# Patient Record
Sex: Female | Born: 1940 | Race: White | Hispanic: No | Smoking: Never smoker
Health system: Southern US, Community
[De-identification: ages and names within clinical notes are randomized; demographics above are authoritative.]

## PROBLEM LIST (undated history)

## (undated) DIAGNOSIS — Z9889 Other specified postprocedural states: Secondary | ICD-10-CM

## (undated) DIAGNOSIS — I350 Nonrheumatic aortic (valve) stenosis: Secondary | ICD-10-CM

## (undated) DIAGNOSIS — E039 Hypothyroidism, unspecified: Secondary | ICD-10-CM

## (undated) DIAGNOSIS — I10 Essential (primary) hypertension: Secondary | ICD-10-CM

## (undated) DIAGNOSIS — I351 Nonrheumatic aortic (valve) insufficiency: Secondary | ICD-10-CM

## (undated) DIAGNOSIS — M2170 Unequal limb length (acquired), unspecified site: Secondary | ICD-10-CM

## (undated) DIAGNOSIS — Z8489 Family history of other specified conditions: Secondary | ICD-10-CM

## (undated) DIAGNOSIS — R011 Cardiac murmur, unspecified: Secondary | ICD-10-CM

## (undated) DIAGNOSIS — Q211 Atrial septal defect: Secondary | ICD-10-CM

## (undated) DIAGNOSIS — R0600 Dyspnea, unspecified: Secondary | ICD-10-CM

## (undated) DIAGNOSIS — T8859XA Other complications of anesthesia, initial encounter: Secondary | ICD-10-CM

## (undated) DIAGNOSIS — T4145XA Adverse effect of unspecified anesthetic, initial encounter: Secondary | ICD-10-CM

## (undated) DIAGNOSIS — E785 Hyperlipidemia, unspecified: Secondary | ICD-10-CM

## (undated) DIAGNOSIS — I701 Atherosclerosis of renal artery: Secondary | ICD-10-CM

## (undated) DIAGNOSIS — M1990 Unspecified osteoarthritis, unspecified site: Secondary | ICD-10-CM

## (undated) DIAGNOSIS — I253 Aneurysm of heart: Secondary | ICD-10-CM

## (undated) DIAGNOSIS — I2510 Atherosclerotic heart disease of native coronary artery without angina pectoris: Secondary | ICD-10-CM

## (undated) DIAGNOSIS — Q2112 Patent foramen ovale: Secondary | ICD-10-CM

## (undated) HISTORY — DX: Nonrheumatic aortic (valve) stenosis: I35.0

## (undated) HISTORY — PX: CORONARY ANGIOPLASTY WITH STENT PLACEMENT: SHX49

## (undated) HISTORY — DX: Atherosclerosis of renal artery: I70.1

## (undated) HISTORY — DX: Hyperlipidemia, unspecified: E78.5

## (undated) HISTORY — PX: JOINT REPLACEMENT: SHX530

## (undated) HISTORY — DX: Atrial septal defect: Q21.1

## (undated) HISTORY — PX: RENAL ARTERY STENT: SHX2321

## (undated) HISTORY — PX: TONSILLECTOMY: SUR1361

## (undated) HISTORY — DX: Unequal limb length (acquired), unspecified site: M21.70

## (undated) HISTORY — DX: Hypothyroidism, unspecified: E03.9

## (undated) HISTORY — DX: Nonrheumatic aortic (valve) insufficiency: I35.1

## (undated) HISTORY — PX: ABDOMINAL HYSTERECTOMY: SHX81

## (undated) HISTORY — DX: Aneurysm of heart: I25.3

## (undated) HISTORY — DX: Other specified postprocedural states: Z98.890

## (undated) HISTORY — PX: TUBAL LIGATION: SHX77

## (undated) HISTORY — DX: Patent foramen ovale: Q21.12

---
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
## 2017-05-15 HISTORY — PX: CARDIAC CATHETERIZATION: SHX172

## 2018-11-29 ENCOUNTER — Other Ambulatory Visit: Payer: Self-pay | Admitting: Orthopaedic Surgery

## 2018-12-24 NOTE — Patient Instructions (Addendum)
YOU NEED TO HAVE A COVID 19 TEST ON__8-14-20_____ @_______ , THIS TEST MUST BE DONE BEFORE SURGERY, COME  [address], [place] , [phone]. ONCE YOUR COVID TEST IS COMPLETED, PLEASE BEGIN THE QUARANTINE INSTRUCTIONS AS OUTLINED IN YOUR HANDOUT.                Sherie DonLydia Deadwyler  12/24/2018   Your procedure is scheduled on: 12-31-18   [facility]              Report to short stay  at       0530  AM   1 VISITOR IS ALLOWED TO WAIT IN WAITING ROOM  ONLY DAY OF YOUR SURGERY.    Call this number if you have problems the morning of surgery [phone]    Remember:NO SOLID FOOD AFTER MIDNIGHT THE NIGHT PRIOR TO SURGERY. NOTHING BY MOUTH EXCEPT CLEAR LIQUIDS UNTIL 3 HOURS PRIOR TO SCHEULED SURGERY. PLEASE   FINISH ENSURE DRINK PER SURGEON ORDER 3 HOURS PRIOR TO SCHEDULED SURGERY TIME WHICH NEEDS TO BE COMPLETED AT _0430 AM THEN NOTHING BY MOUTH___________.  CLEAR LIQUID DIET   Foods Allowed                                                                     Foods Excluded  Coffee and tea, regular and decaf                             liquids that you cannot  Plain Jell-O any favor except red or purple                                           see through such as: Fruit ices (not with fruit pulp)                                     milk, soups, orange juice  Iced Popsicles                                    All solid food Carbonated beverages, regular and diet                                    Cranberry, grape and apple juices Sports drinks like Gatorade Lightly seasoned clear broth or consume(fat free) Sugar, honey syrup  Sample Menu Breakfast                                Lunch                                     Supper Cranberry juice  Beef broth                            Chicken broth Jell-O                                     Grape juice                           Apple juice Coffee or tea                        Jell-O                                       Popsicle                                                Coffee or tea                        Coffee or tea  _____________________________________________________________________      BRUSH YOUR TEETH MORNING OF SURGERY AND RINSE YOUR MOUTH OUT, NO CHEWING GUM CANDY OR MINTS.     Take these medicines the morning of surgery with A SIP OF WATER: METOPROLOL, AMLODIPINE                                You may not have any metal on your body including hair pins and              piercings  Do not wear jewelry, make-up, lotions, powders or perfumes, deodorant             Do not wear nail polish.  Do not shave  48 hours prior to surgery.                 Do not bring valuables to the hospital.  IS NOT             RESPONSIBLE   FOR VALUABLES.  Contacts, dentures or bridgework may not be worn into surgery.                Please read over the following fact sheets you were given: ____________________________________________________________________          [facility] - Preparing for Surgery Before surgery, you can play an important role.  Because skin is not sterile, your skin needs to be as free of germs as possible.  You can reduce the number of germs on your skin by washing with CHG (chlorahexidine gluconate) soap before surgery.  CHG is an antiseptic cleaner which kills germs and bonds with the skin to continue killing germs even after washing. Please DO NOT use if you have an allergy to CHG or antibacterial soaps.  If your skin becomes reddened/irritated stop using the CHG and inform your nurse when you arrive at Short Stay. Do not shave (including legs and underarms) for at least 48 hours prior to the first CHG shower.  You may shave your face/neck. Please follow  these instructions carefully:  1.  Shower with CHG Soap the night before surgery and the  morning of Surgery.  2.  If you choose to wash your hair, wash your hair first as usual with  your  normal  shampoo.  3.  After you shampoo, rinse your hair and body thoroughly to remove the  shampoo.                           4.  Use CHG as you would any other liquid soap.  You can apply chg directly  to the skin and wash                       Gently with a scrungie or clean washcloth.  5.  Apply the CHG Soap to your body ONLY FROM THE NECK DOWN.   Do not use on face/ open                           Wound or open sores. Avoid contact with eyes, ears mouth and genitals (private parts).                       Wash face,  Genitals (private parts) with your normal soap.             6.  Wash thoroughly, paying special attention to the area where your surgery  will be performed.  7.  Thoroughly rinse your body with warm water from the neck down.  8.  DO NOT shower/wash with your normal soap after using and rinsing off  the CHG Soap.                9.  Pat yourself dry with a clean towel.            10.  Wear clean pajamas.            11.  Place clean sheets on your bed the night of your first shower and do not  sleep with pets. Day of Surgery : Do not apply any lotions/deodorants the morning of surgery.  Please wear clean clothes to the hospital/surgery center.  FAILURE TO FOLLOW THESE INSTRUCTIONS MAY RESULT IN THE CANCELLATION OF YOUR SURGERY PATIENT SIGNATURE_________________________________  NURSE SIGNATURE__________________________________  ________________________________________________________________________  WHAT IS A BLOOD TRANSFUSION? Blood Transfusion Information  A transfusion is the replacement of blood or some of its parts. Blood is made up of multiple cells which provide different functions.  Red blood cells carry oxygen and are used for blood loss replacement.  White blood cells fight against infection.  Platelets control bleeding.  Plasma helps clot blood.  Other blood products are available for specialized needs, such as hemophilia or other clotting  disorders. BEFORE THE TRANSFUSION  Who gives blood for transfusions?   Healthy volunteers who are fully evaluated to make sure their blood is safe. This is blood bank blood. Transfusion therapy is the safest it has ever been in the practice of medicine. Before blood is taken from a donor, a complete history is taken to make sure that person has no history of diseases nor engages in risky social behavior (examples are intravenous drug use or sexual activity with multiple partners). The donor's travel history is screened to minimize risk of transmitting infections, such as malaria. The donated blood is tested for signs of infectious diseases, such as HIV and  hepatitis. The blood is then tested to be sure it is compatible with you in order to minimize the chance of a transfusion reaction. If you or a relative donates blood, this is often done in anticipation of surgery and is not appropriate for emergency situations. It takes many days to process the donated blood. RISKS AND COMPLICATIONS Although transfusion therapy is very safe and saves many lives, the main dangers of transfusion include:   Getting an infectious disease.  Developing a transfusion reaction. This is an allergic reaction to something in the blood you were given. Every precaution is taken to prevent this. The decision to have a blood transfusion has been considered carefully by your caregiver before blood is given. Blood is not given unless the benefits outweigh the risks. AFTER THE TRANSFUSION  Right after receiving a blood transfusion, you will usually feel much better and more energetic. This is especially true if your red blood cells have gotten low (anemic). The transfusion raises the level of the red blood cells which carry oxygen, and this usually causes an energy increase.  The nurse administering the transfusion will monitor you carefully for complications. HOME CARE INSTRUCTIONS  No special instructions are needed after a  transfusion. You may find your energy is better. Speak with your caregiver about any limitations on activity for underlying diseases you may have. SEEK MEDICAL CARE IF:   Your condition is not improving after your transfusion.  You develop redness or irritation at the intravenous (IV) site. SEEK IMMEDIATE MEDICAL CARE IF:  Any of the following symptoms occur over the next 12 hours:  Shaking chills.  You have a temperature by mouth above 102 F (38.9 C), not controlled by medicine.  Chest, back, or muscle pain.  People around you feel you are not acting correctly or are confused.  Shortness of breath or difficulty breathing.  Dizziness and fainting.  You get a rash or develop hives.  You have a decrease in urine output.  Your urine turns a dark color or changes to pink, red, or brown. Any of the following symptoms occur over the next 10 days:  You have a temperature by mouth above 102 F (38.9 C), not controlled by medicine.  Shortness of breath.  Weakness after normal activity.  The white part of the eye turns yellow (jaundice).  You have a decrease in the amount of urine or are urinating less often.  Your urine turns a dark color or changes to pink, red, or brown. Document Released: 04/28/2000 Document Revised: 07/24/2011 Document Reviewed: 12/16/2007 ExitCare Patient Information 2014 [place].  _______________________________________________________________________  Incentive Spirometer  An incentive spirometer is a tool that can help keep your lungs clear and active. This tool measures how well you are filling your lungs with each breath. Taking long deep breaths may help reverse or decrease the chance of developing breathing (pulmonary) problems (especially infection) following:  A long period of time when you are unable to move or be active. BEFORE THE PROCEDURE   If the spirometer includes an indicator to show your best effort, your nurse or  respiratory therapist will set it to a desired goal.  If possible, sit up straight or lean slightly forward. Try not to slouch.  Hold the incentive spirometer in an upright position. INSTRUCTIONS FOR USE  1. Sit on the edge of your bed if possible, or sit up as far as you can in bed or on a chair. 2. Hold the incentive spirometer in an upright position.  3. Breathe out normally. 4. Place the mouthpiece in your mouth and seal your lips tightly around it. 5. Breathe in slowly and as deeply as possible, raising the piston or the ball toward the top of the column. 6. Hold your breath for 3-5 seconds or for as long as possible. Allow the piston or ball to fall to the bottom of the column. 7. Remove the mouthpiece from your mouth and breathe out normally. 8. Rest for a few seconds and repeat Steps 1 through 7 at least 10 times every 1-2 hours when you are awake. Take your time and take a few normal breaths between deep breaths. 9. The spirometer may include an indicator to show your best effort. Use the indicator as a goal to work toward during each repetition. 10. After each set of 10 deep breaths, practice coughing to be sure your lungs are clear. If you have an incision (the cut made at the time of surgery), support your incision when coughing by placing a pillow or rolled up towels firmly against it. Once you are able to get out of bed, walk around indoors and cough well. You may stop using the incentive spirometer when instructed by your caregiver.  RISKS AND COMPLICATIONS  Take your time so you do not get dizzy or light-headed.  If you are in pain, you may need to take or ask for pain medication before doing incentive spirometry. It is harder to take a deep breath if you are having pain. AFTER USE  Rest and breathe slowly and easily.  It can be helpful to keep track of a log of your progress. Your caregiver can provide you with a simple table to help with this. If you are using the  spirometer at home, follow these instructions: [place] IF:   You are having difficultly using the spirometer.  You have trouble using the spirometer as often as instructed.  Your pain medication is not giving enough relief while using the spirometer.  You develop fever of 100.5 F (38.1 C) or higher. SEEK IMMEDIATE MEDICAL CARE IF:   You cough up bloody sputum that had not been present before.  You develop fever of 102 F (38.9 C) or greater.  You develop worsening pain at or near the incision site. MAKE SURE YOU:   Understand these instructions.  Will watch your condition.  Will get help right away if you are not doing well or get worse. Document Released: 09/11/2006 Document Revised: 07/24/2011 Document Reviewed: 11/12/2006 [facility] Patient Information 2014 [place], [place].   ________________________________________________________________________

## 2018-12-25 ENCOUNTER — Encounter (HOSPITAL_COMMUNITY): Payer: Self-pay

## 2018-12-25 ENCOUNTER — Ambulatory Visit (HOSPITAL_COMMUNITY)
Admission: RE | Admit: 2018-12-25 | Discharge: 2018-12-25 | Disposition: A | Discharge status: Home / Self Care | Payer: Medicare Other | Source: Ambulatory Visit | Attending: Orthopaedic Surgery | Admitting: Orthopaedic Surgery

## 2018-12-25 ENCOUNTER — Encounter (HOSPITAL_COMMUNITY)
Admission: RE | Admit: 2018-12-25 | Discharge: 2018-12-25 | Disposition: A | Discharge status: Home / Self Care | Payer: Medicare Other | Source: Ambulatory Visit | Attending: Orthopaedic Surgery | Admitting: Orthopaedic Surgery

## 2018-12-25 ENCOUNTER — Other Ambulatory Visit: Payer: Self-pay

## 2018-12-25 DIAGNOSIS — M1611 Unilateral primary osteoarthritis, right hip: Secondary | ICD-10-CM | POA: Diagnosis not present

## 2018-12-25 DIAGNOSIS — Z01818 Encounter for other preprocedural examination: Secondary | ICD-10-CM | POA: Insufficient documentation

## 2018-12-25 HISTORY — DX: Unspecified osteoarthritis, unspecified site: M19.90

## 2018-12-25 HISTORY — DX: Dyspnea, unspecified: R06.00

## 2018-12-25 HISTORY — DX: Other complications of anesthesia, initial encounter: T88.59XA

## 2018-12-25 HISTORY — DX: Essential (primary) hypertension: I10

## 2018-12-25 HISTORY — DX: Family history of other specified conditions: Z84.89

## 2018-12-25 HISTORY — DX: Cardiac murmur, unspecified: R01.1

## 2018-12-25 HISTORY — DX: Atherosclerotic heart disease of native coronary artery without angina pectoris: I25.10

## 2018-12-25 LAB — BASIC METABOLIC PANEL
Anion gap: 8 (ref 5–15)
BUN: 26 mg/dL — ABNORMAL HIGH (ref 8–23)
CO2: 28 mmol/L (ref 22–32)
Calcium: 9.6 mg/dL (ref 8.9–10.3)
Chloride: 98 mmol/L (ref 98–111)
Creatinine, Ser: 1.41 mg/dL — ABNORMAL HIGH (ref 0.44–1.00)
GFR calc Af Amer: 41 mL/min — ABNORMAL LOW (ref >60)
GFR calc non Af Amer: 36 mL/min — ABNORMAL LOW (ref >60)
Glucose, Bld: 95 mg/dL (ref 70–99)
Potassium: 4.7 mmol/L (ref 3.5–5.1)
Sodium: 134 mmol/L — ABNORMAL LOW (ref 135–145)

## 2018-12-25 LAB — CBC WITH DIFFERENTIAL/PLATELET
Abs Immature Granulocytes: 0.01 10*3/uL (ref 0.00–0.07)
Basophils Absolute: 0 10*3/uL (ref 0.0–0.1)
Basophils Relative: 1 %
Eosinophils Absolute: 0.1 10*3/uL (ref 0.0–0.5)
Eosinophils Relative: 3 %
HCT: 38.9 % (ref 36.0–46.0)
Hemoglobin: 12.2 g/dL (ref 12.0–15.0)
Immature Granulocytes: 0 %
Lymphocytes Relative: 26 %
Lymphs Abs: 1.2 10*3/uL (ref 0.7–4.0)
MCH: 29 pg (ref 26.0–34.0)
MCHC: 31.4 g/dL (ref 30.0–36.0)
MCV: 92.4 fL (ref 80.0–100.0)
Monocytes Absolute: 0.5 10*3/uL (ref 0.1–1.0)
Monocytes Relative: 10 %
Neutro Abs: 3 10*3/uL (ref 1.7–7.7)
Neutrophils Relative %: 60 %
Platelets: 248 10*3/uL (ref 150–400)
RBC: 4.21 MIL/uL (ref 3.87–5.11)
RDW: 14.5 % (ref 11.5–15.5)
WBC: 4.8 10*3/uL (ref 4.0–10.5)
nRBC: 0 % (ref 0.0–0.2)

## 2018-12-25 LAB — URINALYSIS, ROUTINE W REFLEX MICROSCOPIC
Bilirubin Urine: NEGATIVE
Glucose, UA: NEGATIVE mg/dL
Hgb urine dipstick: NEGATIVE
Ketones, ur: NEGATIVE mg/dL
Leukocytes,Ua: NEGATIVE
Nitrite: NEGATIVE
Protein, ur: NEGATIVE mg/dL
Specific Gravity, Urine: 1.006 (ref 1.005–1.030)
pH: 7 (ref 5.0–8.0)

## 2018-12-25 LAB — PROTIME-INR
INR: 1 (ref 0.8–1.2)
Prothrombin Time: 12.6 seconds (ref 11.4–15.2)

## 2018-12-25 LAB — ABO/RH: ABO/RH(D): O POS

## 2018-12-25 LAB — SURGICAL PCR SCREEN
MRSA, PCR: NEGATIVE
Staphylococcus aureus: NEGATIVE

## 2018-12-25 LAB — APTT: aPTT: 58 seconds — ABNORMAL HIGH (ref 24–36)

## 2018-12-26 ENCOUNTER — Encounter (HOSPITAL_COMMUNITY): Payer: Self-pay

## 2018-12-27 ENCOUNTER — Other Ambulatory Visit: Payer: Self-pay | Admitting: Orthopaedic Surgery

## 2018-12-27 ENCOUNTER — Other Ambulatory Visit (HOSPITAL_COMMUNITY)
Admission: RE | Admit: 2018-12-27 | Discharge: 2018-12-27 | Disposition: A | Discharge status: Home / Self Care | Payer: Medicare Other | Source: Ambulatory Visit | Attending: Orthopaedic Surgery | Admitting: Orthopaedic Surgery

## 2018-12-27 DIAGNOSIS — M1611 Unilateral primary osteoarthritis, right hip: Secondary | ICD-10-CM | POA: Insufficient documentation

## 2018-12-27 DIAGNOSIS — Z20828 Contact with and (suspected) exposure to other viral communicable diseases: Secondary | ICD-10-CM | POA: Diagnosis not present

## 2018-12-27 DIAGNOSIS — Z01812 Encounter for preprocedural laboratory examination: Secondary | ICD-10-CM | POA: Insufficient documentation

## 2018-12-27 LAB — SARS CORONAVIRUS 2 (TAT 6-24 HRS): SARS Coronavirus 2: NEGATIVE

## 2018-12-27 NOTE — Care Plan (Signed)
Spoke with patient prior to surgery. All questions answered. She plans to return to her apartment at [place]. She will receive her therapy onsite there. I have ordered her a rolling walker and a 3n1 for home use. Orders for therapy sent to [facility] at the therapy department at [place]. Patient and MD agreeable with plan. Choice offered    Ladell Heads, [place]

## 2018-12-30 MED ORDER — BUPIVACAINE LIPOSOME 1.3 % IJ SUSP
10.0000 mL | Freq: Once | INTRAMUSCULAR | Status: DC
  Filled 2018-12-30: qty 10

## 2018-12-30 MED ORDER — TRANEXAMIC ACID 1000 MG/10ML IV SOLN
2000.0000 mg | INTRAVENOUS | Status: DC
  Filled 2018-12-30: qty 20

## 2018-12-30 NOTE — Progress Notes (Signed)
Anesthesia Chart Review   Case: [id number] Date/Time: 12/31/18 0715   Procedure: Right Anterior Hip Arthroplasty (Right Hip)   Anesthesia type: Spinal   Pre-op diagnosis: Right Hip Degenerative Joint Disease   Location: [facility] ROOM 05 / [facility]   Surgeon: Melrose Nakayama, MD      DISCUSSION:78 y.o. never smoker with h/o HTN, renal artery stenosis, CAD (DES mid [facility] 11/19/17), right hip DJD scheduled for above procedure 12/31/2018.    Pt last seen by cardiologist, Dr. Alla German, 10/23/2018.  She is currently on ASA, she has completed 6 months of her dual antiplatelet therapy.  Per OV note, "She is contemplating hip replacement surgery and we discussed that should be relatively low risk given fully revascularized and normal EF."  Anticipate pt can proceed with planned procedure barring acute status change.    VS: BP (!) 161/68   Pulse 67   Temp 36.5 C (Oral)   Resp 16   Ht 5' (1.524 m)   Wt 62.6 kg   SpO2 100%   BMI 26.95 kg/m   PROVIDERS: Purvis Sheffield, MD  Alla German, MD is Cardiologist  LABS: Labs reviewed: Acceptable for surgery. (all labs ordered are listed, but only abnormal results are displayed)  Labs Reviewed  APTT - Abnormal; Notable for the following components:      Result Value   aPTT 58 (*)    All other components within normal limits  BASIC METABOLIC PANEL - Abnormal; Notable for the following components:   Sodium 134 (*)    BUN 26 (*)    Creatinine, Ser 1.41 (*)    GFR calc non Af Amer 36 (*)    GFR calc Af Amer 41 (*)    All other components within normal limits  URINALYSIS, ROUTINE W REFLEX MICROSCOPIC - Abnormal; Notable for the following components:   Color, Urine STRAW (*)    All other components within normal limits  SURGICAL PCR SCREEN  CBC WITH DIFFERENTIAL/PLATELET  PROTIME-INR  TYPE AND SCREEN  ABO/RH     IMAGES:   EKG: 12/25/2018 Rate 65 bpm Normal sinus rhythm  Normal ECG   CV: Echo 10/17/2018 Conclusions The left  ventricular chamber size is normal There is normal left ventricular systolic function The estimated ejection fraction is 60-65% Abnormal left ventricular diastolic filling is observed, consistent with impaired relaxation. The left atrium is mildly dilated There is mild aortic stenosis  Past Medical History:  Diagnosis Date  . Arthritis   . Complication of anesthesia    Trouble voiding  after  . Coronary artery disease   . Dyspnea    with activity  . Family history of adverse reaction to anesthesia    sister is hard to wake up and violent vomiting  . Heart murmur   . Hypertension     Past Surgical History:  Procedure Laterality Date  . ABDOMINAL HYSTERECTOMY    . CARDIAC CATHETERIZATION    . CORONARY ANGIOPLASTY WITH STENT PLACEMENT     x1  . JOINT REPLACEMENT     left hip  . RENAL ARTERY STENT    . TONSILLECTOMY    . TUBAL LIGATION      MEDICATIONS: . acetaminophen (TYLENOL) 500 MG tablet  . amLODipine (NORVASC) 5 MG tablet  . aspirin EC 81 MG tablet  . calcium carbonate (OSCAL) 1500 (600 Ca) MG TABS tablet  . levothyroxine (SYNTHROID) 88 MCG tablet  . lisinopril (ZESTRIL) 40 MG tablet  . metoprolol succinate (TOPROL-XL) 25 MG 24 hr  tablet  . pravastatin (PRAVACHOL) 20 MG tablet   No current facility-administered medications for this encounter.    Melene Muller. [START ON 12/31/2018] bupivacaine liposome (EXPAREL) 1.3 % injection 133 mg  . [START ON 12/31/2018] tranexamic acid (CYKLOKAPRON) 2,000 mg in sodium chloride 0.9 % 50 mL Topical Application     Janey GentaJessica Jaileigh Weimer, PA-C [facility] Pre-Surgical Testing [phone] 12/30/18  2:43 PM

## 2018-12-30 NOTE — H&P (Signed)
TOTAL HIP ADMISSION H&P  Patient is admitted for right total hip arthroplasty.  Subjective:  Chief Complaint: right hip pain  HPI: Caitlin Logan, 78 y.o. female, has a history of pain and functional disability in the right hip(s) due to arthritis and patient has failed non-surgical conservative treatments for greater than 12 weeks to include NSAID's and/or analgesics, flexibility and strengthening excercises, use of assistive devices, weight reduction as appropriate and activity modification.  Onset of symptoms was gradual starting 5 years ago with gradually worsening course since that time.The patient noted no past surgery on the right hip(s).  Patient currently rates pain in the right hip at 10 out of 10 with activity. Patient has night pain, worsening of pain with activity and weight bearing, trendelenberg gait, pain that interfers with activities of daily living, pain with passive range of motion and crepitus. Patient has evidence of subchondral cysts, subchondral sclerosis, periarticular osteophytes and joint space narrowing by imaging studies. This condition presents safety issues increasing the risk of falls. There is no current active infection.  There are no active problems to display for this patient.  Past Medical History:  Diagnosis Date  . Arthritis   . Complication of anesthesia    Trouble voiding  after  . Coronary artery disease   . Dyspnea    with activity  . Family history of adverse reaction to anesthesia    sister is hard to wake up and violent vomiting  . Heart murmur   . Hypertension     Past Surgical History:  Procedure Laterality Date  . ABDOMINAL HYSTERECTOMY    . CARDIAC CATHETERIZATION    . CORONARY ANGIOPLASTY WITH STENT PLACEMENT     x1  . JOINT REPLACEMENT     left hip  . RENAL ARTERY STENT    . TONSILLECTOMY    . TUBAL LIGATION      Current Facility-Administered Medications  Medication Dose Route Frequency Provider Last Rate Last Dose  . [START  ON 12/31/2018] bupivacaine liposome (EXPAREL) 1.3 % injection 133 mg  10 mL Other Once Melrose Nakayama, MD      . Derrill Memo ON 12/31/2018] tranexamic acid (CYKLOKAPRON) 2,000 mg in sodium chloride 0.9 % 50 mL Topical Application  8,182 mg Topical To OR Melrose Nakayama, MD       Current Outpatient Medications  Medication Sig Dispense Refill Last Dose  . acetaminophen (TYLENOL) 500 MG tablet Take 500 mg by mouth daily as needed for moderate pain.     Marland Kitchen amLODipine (NORVASC) 5 MG tablet Take 2.5 mg by mouth 2 (two) times daily.     Marland Kitchen aspirin EC 81 MG tablet Take 81 mg by mouth daily.     . calcium carbonate (OSCAL) 1500 (600 Ca) MG TABS tablet Take 600 mg of elemental calcium by mouth daily.     Marland Kitchen levothyroxine (SYNTHROID) 88 MCG tablet Take 88 mcg by mouth at bedtime.     Marland Kitchen lisinopril (ZESTRIL) 40 MG tablet Take 40 mg by mouth daily.     . metoprolol succinate (TOPROL-XL) 25 MG 24 hr tablet Take 12.5 mg by mouth 2 (two) times daily.     . pravastatin (PRAVACHOL) 20 MG tablet Take 20 mg by mouth at bedtime.      No Known Allergies  Social History   Tobacco Use  . Smoking status: Never Smoker  . Smokeless tobacco: Never Used  Substance Use Topics  . Alcohol use: Yes    Alcohol/week: 1.0 standard drinks  Types: 1 Glasses of wine per week    Comment: 2 x a week    No family history on file.   Review of Systems  Musculoskeletal: Positive for joint pain.       Right hip  All other systems reviewed and are negative.   Objective:  Physical Exam  Constitutional: She is oriented to person, place, and time. She appears well-developed and well-nourished.  HENT:  Head: Normocephalic and atraumatic.  Eyes: Pupils are equal, round, and reactive to light.  Neck: Normal range of motion.  Cardiovascular: Normal rate and regular rhythm.  Respiratory: Effort normal.  GI: Soft.  Musculoskeletal:     Comments: Right hip motion is limited and painful in internal rotation.  Her leg lengths are a  little bit unequal with this side slightly short.  Sensation and motor function are intact distally.  She has palpable pulses in her feet.  Opposite hip moves well.  She has a well-healed posterior scar there.  Neurological: She is alert and oriented to person, place, and time.  Skin: Skin is warm and dry.  Psychiatric: She has a normal mood and affect. Her behavior is normal. Judgment and thought content normal.    Vital signs in last 24 hours:    Labs:   Estimated body mass index is 26.95 kg/m as calculated from the following:   Height as of 12/25/18: 5' (1.524 m).   Weight as of 12/25/18: 62.6 kg.   Imaging Review Plain radiographs demonstrate severe degenerative joint disease of the right hip(s). The bone quality appears to be good for age and reported activity level.      Assessment/Plan:  End stage primary arthritis, right hip(s)  The patient history, physical examination, clinical judgement of the provider and imaging studies are consistent with end stage degenerative joint disease of the right hip(s) and total hip arthroplasty is deemed medically necessary. The treatment options including medical management, injection therapy, arthroscopy and arthroplasty were discussed at length. The risks and benefits of total hip arthroplasty were presented and reviewed. The risks due to aseptic loosening, infection, stiffness, dislocation/subluxation,  thromboembolic complications and other imponderables were discussed.  The patient acknowledged the explanation, agreed to proceed with the plan and consent was signed. Patient is being admitted for inpatient treatment for surgery, pain control, PT, OT, prophylactic antibiotics, VTE prophylaxis, progressive ambulation and ADL's and discharge planning.The patient is planning to be discharged home with home health services

## 2018-12-30 NOTE — Anesthesia Preprocedure Evaluation (Addendum)
Anesthesia Evaluation  Patient identified by MRN, date of birth, ID band Patient awake    Reviewed: Allergy & Precautions, NPO status , Patient's Chart, lab work & pertinent test results  Airway Mallampati: II  TM Distance: >3 FB Neck ROM: Full    Dental no notable dental hx.    Pulmonary neg pulmonary ROS,    Pulmonary exam normal breath sounds clear to auscultation       Cardiovascular hypertension, + CAD and + Cardiac Stents (DES 7/19)  Normal cardiovascular exam Rhythm:Regular Rate:Normal     Neuro/Psych negative neurological ROS  negative psych ROS   GI/Hepatic negative GI ROS, Neg liver ROS,   Endo/Other  negative endocrine ROS  Renal/GU negative Renal ROS  negative genitourinary   Musculoskeletal negative musculoskeletal ROS (+)   Abdominal   Peds negative pediatric ROS (+)  Hematology negative hematology ROS (+)   Anesthesia Other Findings   Reproductive/Obstetrics negative OB ROS                            Anesthesia Physical Anesthesia Plan  ASA: II  Anesthesia Plan: Spinal   Post-op Pain Management:    Induction: Intravenous  PONV Risk Score and Plan: 2 and Treatment may vary due to age or medical condition  Airway Management Planned: Simple Face Mask  Additional Equipment:   Intra-op Plan:   Post-operative Plan:   Informed Consent: I have reviewed the patients History and Physical, chart, labs and discussed the procedure including the risks, benefits and alternatives for the proposed anesthesia with the patient or authorized representative who has indicated his/her understanding and acceptance.     Dental advisory given  Plan Discussed with: CRNA  Anesthesia Plan Comments: (See PAT note 12/25/2018, Konrad Felix, PA-C)       Anesthesia Quick Evaluation

## 2018-12-31 ENCOUNTER — Encounter (HOSPITAL_COMMUNITY)
Admission: RE | Disposition: A | Discharge status: Skilled Nursing Facility | Payer: Self-pay | Source: Home / Self Care | Attending: Orthopaedic Surgery

## 2018-12-31 ENCOUNTER — Ambulatory Visit (HOSPITAL_COMMUNITY): Payer: Medicare Other

## 2018-12-31 ENCOUNTER — Ambulatory Visit (HOSPITAL_COMMUNITY): Payer: Medicare Other | Admitting: Physician Assistant

## 2018-12-31 ENCOUNTER — Other Ambulatory Visit: Payer: Self-pay

## 2018-12-31 ENCOUNTER — Encounter (HOSPITAL_COMMUNITY): Payer: Self-pay | Admitting: *Deleted

## 2018-12-31 ENCOUNTER — Observation Stay (HOSPITAL_COMMUNITY)
Admission: RE | Admit: 2018-12-31 | Discharge: 2019-01-02 | Disposition: A | Discharge status: Skilled Nursing Facility | Payer: Medicare Other | Attending: Orthopaedic Surgery | Admitting: Orthopaedic Surgery

## 2018-12-31 ENCOUNTER — Ambulatory Visit (HOSPITAL_COMMUNITY): Payer: Medicare Other | Admitting: Anesthesiology

## 2018-12-31 DIAGNOSIS — Z79899 Other long term (current) drug therapy: Secondary | ICD-10-CM | POA: Insufficient documentation

## 2018-12-31 DIAGNOSIS — Z955 Presence of coronary angioplasty implant and graft: Secondary | ICD-10-CM | POA: Insufficient documentation

## 2018-12-31 DIAGNOSIS — Z419 Encounter for procedure for purposes other than remedying health state, unspecified: Secondary | ICD-10-CM

## 2018-12-31 DIAGNOSIS — Z96642 Presence of left artificial hip joint: Secondary | ICD-10-CM | POA: Diagnosis not present

## 2018-12-31 DIAGNOSIS — Z7982 Long term (current) use of aspirin: Secondary | ICD-10-CM | POA: Insufficient documentation

## 2018-12-31 DIAGNOSIS — I2510 Atherosclerotic heart disease of native coronary artery without angina pectoris: Secondary | ICD-10-CM | POA: Insufficient documentation

## 2018-12-31 DIAGNOSIS — Z79890 Hormone replacement therapy (postmenopausal): Secondary | ICD-10-CM | POA: Diagnosis not present

## 2018-12-31 DIAGNOSIS — M1611 Unilateral primary osteoarthritis, right hip: Principal | ICD-10-CM | POA: Diagnosis present

## 2018-12-31 DIAGNOSIS — I10 Essential (primary) hypertension: Secondary | ICD-10-CM | POA: Insufficient documentation

## 2018-12-31 HISTORY — PX: TOTAL HIP ARTHROPLASTY: SHX124

## 2018-12-31 LAB — TYPE AND SCREEN
ABO/RH(D): O POS
Antibody Screen: NEGATIVE

## 2018-12-31 SURGERY — ARTHROPLASTY, HIP, TOTAL, ANTERIOR APPROACH
Anesthesia: Spinal | Site: Hip | Laterality: Right

## 2018-12-31 MED ORDER — DIPHENHYDRAMINE HCL 12.5 MG/5ML PO ELIX: 12.5000 mg | ORAL_SOLUTION | ORAL | Status: DC | PRN

## 2018-12-31 MED ORDER — SODIUM CHLORIDE 0.9 % IV SOLN
INTRAVENOUS | Status: DC | PRN
  Administered 2018-12-31: 20 ug/min via INTRAVENOUS

## 2018-12-31 MED ORDER — DEXAMETHASONE SODIUM PHOSPHATE 10 MG/ML IJ SOLN
INTRAMUSCULAR | Status: AC
  Filled 2018-12-31: qty 1

## 2018-12-31 MED ORDER — LACTATED RINGERS IV SOLN
INTRAVENOUS | Status: DC
  Administered 2018-12-31 – 2019-01-01 (×2): via INTRAVENOUS

## 2018-12-31 MED ORDER — STERILE WATER FOR IRRIGATION IR SOLN
Status: DC | PRN
  Administered 2018-12-31 (×2): 1000 mL

## 2018-12-31 MED ORDER — METOPROLOL SUCCINATE ER 25 MG PO TB24
12.5000 mg | ORAL_TABLET | Freq: Two times a day (BID) | ORAL | Status: DC
  Administered 2018-12-31 – 2019-01-02 (×4): 12.5 mg via ORAL
  Filled 2018-12-31 (×5): qty 1

## 2018-12-31 MED ORDER — KETOROLAC TROMETHAMINE 15 MG/ML IJ SOLN
7.5000 mg | Freq: Four times a day (QID) | INTRAMUSCULAR | Status: AC
  Administered 2018-12-31 – 2019-01-01 (×4): 7.5 mg via INTRAVENOUS
  Filled 2018-12-31 (×3): qty 1

## 2018-12-31 MED ORDER — ROCURONIUM BROMIDE 10 MG/ML (PF) SYRINGE
PREFILLED_SYRINGE | INTRAVENOUS | Status: AC
  Filled 2018-12-31: qty 10

## 2018-12-31 MED ORDER — HYDROCODONE-ACETAMINOPHEN 7.5-325 MG PO TABS
ORAL_TABLET | ORAL | Status: AC
  Filled 2018-12-31: qty 1

## 2018-12-31 MED ORDER — METHOCARBAMOL 500 MG IVPB - SIMPLE MED
500.0000 mg | Freq: Four times a day (QID) | INTRAVENOUS | Status: DC | PRN
  Administered 2018-12-31: 500 mg via INTRAVENOUS
  Filled 2018-12-31: qty 50

## 2018-12-31 MED ORDER — PHENOL 1.4 % MT LIQD: 1.0000 | OROMUCOSAL | Status: DC | PRN

## 2018-12-31 MED ORDER — ONDANSETRON HCL 4 MG/2ML IJ SOLN
4.0000 mg | Freq: Four times a day (QID) | INTRAMUSCULAR | Status: DC | PRN
  Administered 2018-12-31 – 2019-01-01 (×2): 4 mg via INTRAVENOUS
  Filled 2018-12-31 (×2): qty 2

## 2018-12-31 MED ORDER — ACETAMINOPHEN 325 MG PO TABS: 325.0000 mg | ORAL_TABLET | Freq: Four times a day (QID) | ORAL | Status: DC | PRN

## 2018-12-31 MED ORDER — ONDANSETRON HCL 4 MG/2ML IJ SOLN
INTRAMUSCULAR | Status: AC
  Filled 2018-12-31: qty 2

## 2018-12-31 MED ORDER — MEPERIDINE HCL 50 MG/ML IJ SOLN: 6.2500 mg | INTRAMUSCULAR | Status: DC | PRN

## 2018-12-31 MED ORDER — METOCLOPRAMIDE HCL 5 MG PO TABS: 5.0000 mg | ORAL_TABLET | Freq: Three times a day (TID) | ORAL | Status: DC | PRN

## 2018-12-31 MED ORDER — BUPIVACAINE-EPINEPHRINE (PF) 0.5% -1:200000 IJ SOLN
INTRAMUSCULAR | Status: AC
  Filled 2018-12-31: qty 30

## 2018-12-31 MED ORDER — FENTANYL CITRATE (PF) 100 MCG/2ML IJ SOLN
INTRAMUSCULAR | Status: DC | PRN
  Administered 2018-12-31 (×2): 50 ug via INTRAVENOUS

## 2018-12-31 MED ORDER — FENTANYL CITRATE (PF) 100 MCG/2ML IJ SOLN: 25.0000 ug | INTRAMUSCULAR | Status: DC | PRN

## 2018-12-31 MED ORDER — EPHEDRINE SULFATE-NACL 50-0.9 MG/10ML-% IV SOSY
PREFILLED_SYRINGE | INTRAVENOUS | Status: DC | PRN
  Administered 2018-12-31: 10 mg via INTRAVENOUS

## 2018-12-31 MED ORDER — METOCLOPRAMIDE HCL 5 MG/ML IJ SOLN: 10.0000 mg | Freq: Once | INTRAMUSCULAR | Status: DC | PRN

## 2018-12-31 MED ORDER — LACTATED RINGERS IV SOLN: INTRAVENOUS | Status: DC

## 2018-12-31 MED ORDER — DEXAMETHASONE SODIUM PHOSPHATE 10 MG/ML IJ SOLN
INTRAMUSCULAR | Status: DC | PRN
  Administered 2018-12-31: 10 mg via INTRAVENOUS

## 2018-12-31 MED ORDER — GLYCOPYRROLATE 0.2 MG/ML IJ SOLN
INTRAMUSCULAR | Status: DC | PRN
  Administered 2018-12-31: 0.2 mg via INTRAVENOUS

## 2018-12-31 MED ORDER — ASPIRIN 81 MG PO CHEW
81.0000 mg | CHEWABLE_TABLET | Freq: Two times a day (BID) | ORAL | Status: DC
  Administered 2019-01-01 – 2019-01-02 (×3): 81 mg via ORAL
  Filled 2018-12-31 (×4): qty 1

## 2018-12-31 MED ORDER — MIDAZOLAM HCL 2 MG/2ML IJ SOLN
INTRAMUSCULAR | Status: AC
  Filled 2018-12-31: qty 2

## 2018-12-31 MED ORDER — TRANEXAMIC ACID 1000 MG/10ML IV SOLN
INTRAVENOUS | Status: DC | PRN
  Administered 2018-12-31: 2000 mg via TOPICAL

## 2018-12-31 MED ORDER — 0.9 % SODIUM CHLORIDE (POUR BTL) OPTIME
TOPICAL | Status: DC | PRN
  Administered 2018-12-31: 1000 mL

## 2018-12-31 MED ORDER — TRANEXAMIC ACID-NACL 1000-0.7 MG/100ML-% IV SOLN
1000.0000 mg | Freq: Once | INTRAVENOUS | Status: AC
  Administered 2018-12-31: 13:00:00 1000 mg via INTRAVENOUS
  Filled 2018-12-31 (×2): qty 100

## 2018-12-31 MED ORDER — TRANEXAMIC ACID-NACL 1000-0.7 MG/100ML-% IV SOLN
1000.0000 mg | INTRAVENOUS | Status: AC
  Administered 2018-12-31: 1000 mg via INTRAVENOUS

## 2018-12-31 MED ORDER — LIDOCAINE 2% (20 MG/ML) 5 ML SYRINGE
INTRAMUSCULAR | Status: AC
  Filled 2018-12-31: qty 5

## 2018-12-31 MED ORDER — PROPOFOL 10 MG/ML IV BOLUS
INTRAVENOUS | Status: AC
  Filled 2018-12-31: qty 80

## 2018-12-31 MED ORDER — CEFAZOLIN SODIUM-DEXTROSE 2-4 GM/100ML-% IV SOLN
2.0000 g | INTRAVENOUS | Status: AC
  Administered 2018-12-31: 2 g via INTRAVENOUS

## 2018-12-31 MED ORDER — ONDANSETRON HCL 4 MG PO TABS: 4.0000 mg | ORAL_TABLET | Freq: Four times a day (QID) | ORAL | Status: DC | PRN

## 2018-12-31 MED ORDER — HYDROCODONE-ACETAMINOPHEN 7.5-325 MG PO TABS
1.0000 | ORAL_TABLET | ORAL | Status: DC | PRN
  Administered 2018-12-31: 1 via ORAL
  Administered 2018-12-31 – 2019-01-02 (×5): 2 via ORAL
  Filled 2018-12-31 (×5): qty 2
  Filled 2018-12-31: qty 1

## 2018-12-31 MED ORDER — CEFAZOLIN SODIUM-DEXTROSE 2-4 GM/100ML-% IV SOLN
INTRAVENOUS | Status: AC
  Filled 2018-12-31: qty 100

## 2018-12-31 MED ORDER — FENTANYL CITRATE (PF) 100 MCG/2ML IJ SOLN
INTRAMUSCULAR | Status: AC
  Filled 2018-12-31: qty 2

## 2018-12-31 MED ORDER — ONDANSETRON HCL 4 MG/2ML IJ SOLN
INTRAMUSCULAR | Status: DC | PRN
  Administered 2018-12-31: 4 mg via INTRAVENOUS

## 2018-12-31 MED ORDER — PROPOFOL 500 MG/50ML IV EMUL
INTRAVENOUS | Status: DC | PRN
  Administered 2018-12-31: 85 ug/kg/min via INTRAVENOUS

## 2018-12-31 MED ORDER — MENTHOL 3 MG MT LOZG: 1.0000 | LOZENGE | OROMUCOSAL | Status: DC | PRN

## 2018-12-31 MED ORDER — METHOCARBAMOL 500 MG PO TABS
500.0000 mg | ORAL_TABLET | Freq: Four times a day (QID) | ORAL | Status: DC | PRN
  Administered 2018-12-31 – 2019-01-02 (×4): 500 mg via ORAL
  Filled 2018-12-31 (×4): qty 1

## 2018-12-31 MED ORDER — DOCUSATE SODIUM 100 MG PO CAPS
100.0000 mg | ORAL_CAPSULE | Freq: Two times a day (BID) | ORAL | Status: DC
  Administered 2018-12-31 – 2019-01-02 (×4): 100 mg via ORAL
  Filled 2018-12-31 (×5): qty 1

## 2018-12-31 MED ORDER — BUPIVACAINE LIPOSOME 1.3 % IJ SUSP
INTRAMUSCULAR | Status: DC | PRN
  Administered 2018-12-31: 10 mL

## 2018-12-31 MED ORDER — ACETAMINOPHEN 500 MG PO TABS
500.0000 mg | ORAL_TABLET | Freq: Four times a day (QID) | ORAL | Status: AC
  Administered 2019-01-01: 06:00:00 500 mg via ORAL
  Filled 2018-12-31: qty 1

## 2018-12-31 MED ORDER — MIDAZOLAM HCL 5 MG/5ML IJ SOLN
INTRAMUSCULAR | Status: DC | PRN
  Administered 2018-12-31 (×2): 1 mg via INTRAVENOUS

## 2018-12-31 MED ORDER — PHENYLEPHRINE HCL (PRESSORS) 10 MG/ML IV SOLN
INTRAVENOUS | Status: AC
  Filled 2018-12-31: qty 1

## 2018-12-31 MED ORDER — AMLODIPINE BESYLATE 5 MG PO TABS
2.5000 mg | ORAL_TABLET | Freq: Two times a day (BID) | ORAL | Status: DC
  Administered 2018-12-31 – 2019-01-01 (×3): 2.5 mg via ORAL
  Filled 2018-12-31 (×5): qty 1

## 2018-12-31 MED ORDER — KETOROLAC TROMETHAMINE 15 MG/ML IJ SOLN
INTRAMUSCULAR | Status: AC
  Administered 2018-12-31: 7.5 mg via INTRAVENOUS
  Filled 2018-12-31: qty 1

## 2018-12-31 MED ORDER — BISACODYL 5 MG PO TBEC: 5.0000 mg | DELAYED_RELEASE_TABLET | Freq: Every day | ORAL | Status: DC | PRN

## 2018-12-31 MED ORDER — LACTATED RINGERS IV SOLN
INTRAVENOUS | Status: DC
  Administered 2018-12-31 (×2): via INTRAVENOUS

## 2018-12-31 MED ORDER — HYDROCODONE-ACETAMINOPHEN 5-325 MG PO TABS
ORAL_TABLET | ORAL | Status: AC
  Filled 2018-12-31: qty 1

## 2018-12-31 MED ORDER — LISINOPRIL 20 MG PO TABS
40.0000 mg | ORAL_TABLET | Freq: Every day | ORAL | Status: DC
  Administered 2018-12-31 – 2019-01-02 (×3): 40 mg via ORAL
  Filled 2018-12-31 (×3): qty 2

## 2018-12-31 MED ORDER — CEFAZOLIN SODIUM-DEXTROSE 2-4 GM/100ML-% IV SOLN
2.0000 g | Freq: Four times a day (QID) | INTRAVENOUS | Status: AC
  Administered 2018-12-31 (×2): 2 g via INTRAVENOUS
  Filled 2018-12-31 (×2): qty 100

## 2018-12-31 MED ORDER — POVIDONE-IODINE 10 % EX SWAB
2.0000 "application " | Freq: Once | CUTANEOUS | Status: AC
  Administered 2018-12-31: 2 via TOPICAL

## 2018-12-31 MED ORDER — ALUM & MAG HYDROXIDE-SIMETH 200-200-20 MG/5ML PO SUSP: 30.0000 mL | ORAL | Status: DC | PRN

## 2018-12-31 MED ORDER — SUCCINYLCHOLINE CHLORIDE 200 MG/10ML IV SOSY
PREFILLED_SYRINGE | INTRAVENOUS | Status: AC
  Filled 2018-12-31: qty 10

## 2018-12-31 MED ORDER — EPHEDRINE 5 MG/ML INJ
INTRAVENOUS | Status: AC
  Filled 2018-12-31: qty 10

## 2018-12-31 MED ORDER — TRANEXAMIC ACID-NACL 1000-0.7 MG/100ML-% IV SOLN
INTRAVENOUS | Status: AC
  Filled 2018-12-31: qty 100

## 2018-12-31 MED ORDER — METOCLOPRAMIDE HCL 5 MG/ML IJ SOLN
5.0000 mg | Freq: Three times a day (TID) | INTRAMUSCULAR | Status: DC | PRN
  Administered 2018-12-31: 10 mg via INTRAVENOUS
  Filled 2018-12-31: qty 2

## 2018-12-31 MED ORDER — MORPHINE SULFATE (PF) 4 MG/ML IV SOLN
INTRAVENOUS | Status: AC
  Filled 2018-12-31: qty 1

## 2018-12-31 MED ORDER — LEVOTHYROXINE SODIUM 88 MCG PO TABS
88.0000 ug | ORAL_TABLET | Freq: Every day | ORAL | Status: DC
  Administered 2018-12-31 – 2019-01-01 (×2): 88 ug via ORAL
  Filled 2018-12-31 (×3): qty 1

## 2018-12-31 MED ORDER — BUPIVACAINE IN DEXTROSE 0.75-8.25 % IT SOLN
INTRATHECAL | Status: DC | PRN
  Administered 2018-12-31: 1.6 mL via INTRATHECAL

## 2018-12-31 MED ORDER — MORPHINE SULFATE (PF) 4 MG/ML IV SOLN
0.5000 mg | INTRAVENOUS | Status: DC | PRN
  Administered 2018-12-31 – 2019-01-02 (×7): 1 mg via INTRAVENOUS
  Filled 2018-12-31 (×6): qty 1

## 2018-12-31 MED ORDER — BUPIVACAINE-EPINEPHRINE (PF) 0.25% -1:200000 IJ SOLN
INTRAMUSCULAR | Status: DC | PRN
  Administered 2018-12-31: 30 mL

## 2018-12-31 MED ORDER — PRAVASTATIN SODIUM 20 MG PO TABS
20.0000 mg | ORAL_TABLET | Freq: Every day | ORAL | Status: DC
  Administered 2018-12-31 – 2019-01-01 (×2): 20 mg via ORAL
  Filled 2018-12-31 (×3): qty 1

## 2018-12-31 MED ORDER — PROPOFOL 10 MG/ML IV BOLUS
INTRAVENOUS | Status: DC | PRN
  Administered 2018-12-31: 20 mg via INTRAVENOUS

## 2018-12-31 MED ORDER — CHLORHEXIDINE GLUCONATE 4 % EX LIQD: 60.0000 mL | Freq: Once | CUTANEOUS | Status: DC

## 2018-12-31 MED ORDER — METHOCARBAMOL 500 MG IVPB - SIMPLE MED
INTRAVENOUS | Status: AC
  Filled 2018-12-31: qty 50

## 2018-12-31 MED ORDER — HYDROCODONE-ACETAMINOPHEN 5-325 MG PO TABS
1.0000 | ORAL_TABLET | ORAL | Status: DC | PRN
  Administered 2018-12-31: 1 via ORAL
  Administered 2019-01-01: 2 via ORAL
  Filled 2018-12-31: qty 2

## 2018-12-31 MED ORDER — GLYCOPYRROLATE PF 0.2 MG/ML IJ SOSY
PREFILLED_SYRINGE | INTRAMUSCULAR | Status: AC
  Filled 2018-12-31: qty 1

## 2018-12-31 SURGICAL SUPPLY — 44 items
BAG DECANTER FOR FLEXI CONT (MISCELLANEOUS) ×3 IMPLANT
BLADE SAW SGTL 18X1.27X75 (BLADE) ×2 IMPLANT
BLADE SAW SGTL 18X1.27X75MM (BLADE) ×1
CELLS DAT CNTRL 66122 CELL SVR (MISCELLANEOUS) ×1 IMPLANT
COVER PERINEAL POST (MISCELLANEOUS) ×3 IMPLANT
COVER SURGICAL LIGHT HANDLE (MISCELLANEOUS) ×3 IMPLANT
COVER WAND RF STERILE (DRAPES) IMPLANT
CUP GRIPTON 48MM 100 HIP (Hips) ×3 IMPLANT
DECANTER SPIKE VIAL GLASS SM (MISCELLANEOUS) ×3 IMPLANT
DRAPE IMP U-DRAPE 54X76 (DRAPES) ×3 IMPLANT
DRAPE STERI IOBAN 125X83 (DRAPES) ×3 IMPLANT
DRAPE U-SHAPE 47X51 STRL (DRAPES) ×6 IMPLANT
DRESSING AQUACEL AG SP 3.5X6 (GAUZE/BANDAGES/DRESSINGS) ×1 IMPLANT
DRSG AQUACEL AG ADV 3.5X10 (GAUZE/BANDAGES/DRESSINGS) IMPLANT
DRSG AQUACEL AG SP 3.5X6 (GAUZE/BANDAGES/DRESSINGS) ×3
DURAPREP 26ML APPLICATOR (WOUND CARE) ×3 IMPLANT
ELECT BLADE TIP CTD 4 INCH (ELECTRODE) ×3 IMPLANT
ELECT REM PT RETURN 15FT ADLT (MISCELLANEOUS) ×3 IMPLANT
ELIMINATOR HOLE APEX DEPUY (Hips) ×3 IMPLANT
GLOVE BIO SURGEON STRL SZ8 (GLOVE) ×6 IMPLANT
GLOVE BIOGEL PI IND STRL 8 (GLOVE) ×2 IMPLANT
GLOVE BIOGEL PI INDICATOR 8 (GLOVE) ×4
GOWN STRL REUS W/TWL XL LVL3 (GOWN DISPOSABLE) ×6 IMPLANT
HEAD FEM STD 32X+5 STRL (Hips) ×3 IMPLANT
HOLDER FOLEY CATH W/STRAP (MISCELLANEOUS) ×3 IMPLANT
KIT TURNOVER KIT A (KITS) IMPLANT
MANIFOLD NEPTUNE II (INSTRUMENTS) ×3 IMPLANT
NEEDLE HYPO 22GX1.5 SAFETY (NEEDLE) ×3 IMPLANT
NS IRRIG 1000ML POUR BTL (IV SOLUTION) ×3 IMPLANT
PACK ANTERIOR HIP CUSTOM (KITS) ×3 IMPLANT
PINN ALTRX NEUT ID X OD 32X48 ×3 IMPLANT
PROTECTOR NERVE ULNAR (MISCELLANEOUS) ×3 IMPLANT
RTRCTR WOUND ALEXIS 18CM MED (MISCELLANEOUS) ×3
STEM FEM ACTIS STD SZ2 (Stem) ×3 IMPLANT
SUT ETHIBOND NAB CT1 #1 30IN (SUTURE) ×6 IMPLANT
SUT VIC AB 1 CT1 36 (SUTURE) ×3 IMPLANT
SUT VIC AB 2-0 CT1 27 (SUTURE) ×2
SUT VIC AB 2-0 CT1 TAPERPNT 27 (SUTURE) ×1 IMPLANT
SUT VIC AB 3-0 PS2 18 (SUTURE) ×2
SUT VIC AB 3-0 PS2 18XBRD (SUTURE) ×1 IMPLANT
SUT VLOC 180 0 24IN GS25 (SUTURE) ×3 IMPLANT
SYR 50ML LL SCALE MARK (SYRINGE) ×3 IMPLANT
TRAY FOLEY MTR SLVR 16FR STAT (SET/KITS/TRAYS/PACK) ×3 IMPLANT
YANKAUER SUCT BULB TIP 10FT TU (MISCELLANEOUS) ×3 IMPLANT

## 2018-12-31 NOTE — Op Note (Signed)

## 2018-12-31 NOTE — Care Plan (Signed)
Ortho Bundle Case Management Note  Patient Details  Name: Caitlin Logan MRN: [id number] Date of Birth: 15-May-1941   Spoke with patient prior to surgery. All questions answered. She plans to return to her apartment at [place]. She will receive her therapy onsite there. I have ordered her a rolling walker and a 3n1 for home use. Orders for therapy sent to [facility] at the therapy department at [place]. Patient and MD agreeable with plan. Choice offered                    DME Arranged:  Bedside commode, Walker rolling DME Agency:  [employer]  HH Arranged:  PT HH Agency:  Other - See comment(patient will recieve Therapy at the [facility] community)  Additional Comments: Please contact me with any questions of if this plan should need to change.  Ladell Heads,  [place] [facility]ist  [phone] 12/31/2018, 9:34 AM

## 2018-12-31 NOTE — Transfer of Care (Signed)
Immediate Anesthesia Transfer of Care Note  Patient: Caitlin Logan  Procedure(s) Performed: Procedure(s): Right Anterior Hip Arthroplasty (Right)  Patient Location: PACU  Anesthesia Type:Spinal  Level of Consciousness:  sedated, patient cooperative and responds to stimulation  Airway & Oxygen Therapy:Patient Spontanous Breathing and Patient connected to face mask oxgen  Post-op Assessment:  Report given to PACU RN and Post -op Vital signs reviewed and stable  Post vital signs:  Reviewed and stable  Last Vitals:  Vitals:   12/31/18 0945 12/31/18 1000  BP: 124/60 133/68  Pulse: 75 71  Resp: 12 18  Temp:  (!) 36.2 C  SpO2: 90% 240%    Complications: No apparent anesthesia complications

## 2018-12-31 NOTE — Interval H&P Note (Signed)
History and Physical Interval Note:  12/31/2018 7:25 AM  Caitlin Logan  has presented today for surgery, with the diagnosis of Right Hip Degenerative Joint Disease.  The various methods of treatment have been discussed with the patient and family. After consideration of risks, benefits and other options for treatment, the patient has consented to  Procedure(s): Right Anterior Hip Arthroplasty (Right) as a surgical intervention.  The patient's history has been reviewed, patient examined, no change in status, stable for surgery.  I have reviewed the patient's chart and labs.  Questions were answered to the patient's satisfaction.     [name]

## 2018-12-31 NOTE — Anesthesia Procedure Notes (Signed)
Spinal  Patient location during procedure: OR Staffing Resident/CRNA: Amaree Leeper, Production designer, theatre/television/film, CRNA Performed: resident/CRNA  Preanesthetic Checklist Completed: patient identified, site marked, surgical consent, pre-op evaluation, timeout performed, IV checked, risks and benefits discussed and monitors and equipment checked Spinal Block Patient position: sitting Prep: DuraPrep Patient monitoring: heart rate, cardiac monitor, continuous pulse ox and blood pressure Approach: midline Location: L3-4 Injection technique: single-shot Needle Needle type: Sprotte  Needle gauge: 24 G Needle length: 9 cm Needle insertion depth: 7 cm Assessment Sensory level: T6 Additional Notes -heme, -para, VSS.  Pt tolerated well.  Lot and expiration date OK.

## 2018-12-31 NOTE — Evaluation (Signed)
Physical Therapy Evaluation Patient Details Name: Caitlin Logan MRN: [id number] DOB: 11/13/1940 Today's Date: 12/31/2018   History of Present Illness  78 yo female s/p R DA-THA on 12/31/18. PMH includes OA, CAD s/p stenting, dyspnea, HTN, L THA.  Clinical Impression  Pt presents with severe R hip pain, post-operative R hip weakness, difficulty performing mobility tasks, N/V with mobility, and decreased activity tolerance post-operatively. Pt to benefit from acute PT to address deficits. Pt ambulated hallway distance with RW with min guard assist, pt taking 2 standing rest breaks due to emesis but pt insisted on continuing ambulation. Pt educated on ankle pumps (20/hour) to perform this afternoon/evening to increase circulation, to pt's tolerance and limited by pain. PT to progress mobility as tolerated, and will continue to follow acutely.        Follow Up Recommendations Follow surgeon's recommendation for DC plan and follow-up therapies;Supervision for mobility/OOB(SNF in pt's living community)    Equipment Recommendations  Other (comment)(defer)    Recommendations for Other Services       Precautions / Restrictions Precautions Precautions: Fall Restrictions Weight Bearing Restrictions: No Other Position/Activity Restrictions: WBAT      Mobility  Bed Mobility Overal bed mobility: Needs Assistance Bed Mobility: Supine to Sit     Supine to sit: HOB elevated;Mod assist     General bed mobility comments: Mod assist for LE management, trunk elevation, and scooting to EOB with use of bed pad. Pt tearful when moving from supine to sit due to R groin pain.  Transfers Overall transfer level: Needs assistance Equipment used: Rolling walker (2 wheeled) Transfers: Sit to/from Stand Sit to Stand: Min assist;+2 safety/equipment;From elevated surface         General transfer comment: Min assist for power up, steadying, verbal cuing for hand placeemnt when  rising.  Ambulation/Gait Ambulation/Gait assistance: Min guard;+2 safety/equipment Gait Distance (Feet): 80 Feet Assistive device: Rolling walker (2 wheeled) Gait Pattern/deviations: Step-to pattern;Step-through pattern;Decreased stride length;Trunk flexed;Antalgic Gait velocity: decr   General Gait Details: Min guard for safety, +2 chair follow. Pt with 2 bouts of emesis during ambulation, insisted on continuing ambulation due to pain relief in standing. Verbal cuing for sequencing, placement in RW, turning with RW, and upright posture.  Stairs            Wheelchair Mobility    Modified Rankin (Stroke Patients Only)       Balance Overall balance assessment: Mild deficits observed, not formally tested                                           Pertinent Vitals/Pain Pain Assessment: 0-10 Pain Score: 9  Pain Location: R hip and groin Pain Descriptors / Indicators: Sore;Discomfort;Grimacing;Crying Pain Intervention(s): Limited activity within patient's tolerance;Monitored during session;Premedicated before session;Repositioned;Ice applied    Home Living Family/patient expects to be discharged to:: Private residence Living Arrangements: Alone Available Help at Discharge: Skilled Nursing Facility Type of Home: Independent living facility                Prior Function Level of Independence: Independent         Comments: Pt reports independence with mobility and ADLs prior to admission     Hand Dominance        Extremity/Trunk Assessment   Upper Extremity Assessment Upper Extremity Assessment: Overall WFL for tasks assessed    Lower Extremity Assessment Lower Extremity  Assessment: Overall WFL for tasks assessed;RLE deficits/detail RLE Deficits / Details: suspected post-surgical hip weakness RLE: Unable to fully assess due to pain RLE Sensation: WNL    Cervical / Trunk Assessment Cervical / Trunk Assessment: Normal  Communication    Communication: No difficulties  Cognition Arousal/Alertness: Awake/alert Behavior During Therapy: WFL for tasks assessed/performed Overall Cognitive Status: Within Functional Limits for tasks assessed                                        General Comments      Exercises     Assessment/Plan    PT Assessment Patient needs continued PT services  PT Problem List Decreased strength;Decreased mobility;Decreased range of motion;Decreased activity tolerance;Decreased balance;Decreased knowledge of use of DME;Pain       PT Treatment Interventions DME instruction;Therapeutic activities;Gait training;Therapeutic exercise;Patient/family education;Balance training;Functional mobility training    PT Goals (Current goals can be found in the Care Plan section)  Acute Rehab PT Goals Patient Stated Goal: decrease pain PT Goal Formulation: With patient Time For Goal Achievement: 01/07/19 Potential to Achieve Goals: Good    Frequency 7X/week   Barriers to discharge        Co-evaluation               AM-PAC PT "6 Clicks" Mobility  Outcome Measure Help needed turning from your back to your side while in a flat bed without using bedrails?: A Lot Help needed moving from lying on your back to sitting on the side of a flat bed without using bedrails?: A Lot Help needed moving to and from a bed to a chair (including a wheelchair)?: A Little Help needed standing up from a chair using your arms (e.g., wheelchair or bedside chair)?: A Little Help needed to walk in hospital room?: A Little Help needed climbing 3-5 steps with a railing? : A Lot 6 Click Score: 15    End of Session Equipment Utilized During Treatment: Gait belt Activity Tolerance: Patient tolerated treatment well;Treatment limited secondary to medical complications (Comment);Patient limited by pain(nausea/emesis) Patient left: in chair;with chair alarm set;with call bell/phone within reach;with  nursing/sitter in room(SCD break for skin integrity) Nurse Communication: Other (comment);Mobility status(N/V) PT Visit Diagnosis: Other abnormalities of gait and mobility (R26.89);Difficulty in walking, not elsewhere classified (R26.2)    Time: 5053-9767 PT Time Calculation (min) (ACUTE ONLY): 27 min   Charges:   PT Evaluation $PT Eval Low Complexity: 1 Low PT Treatments $Gait Training: 8-22 mins       Julien Girt, PT Acute Rehabilitation Services Pager [phone]  Office [phone]   Caitlin Logan 12/31/2018, 5:55 PM

## 2019-01-01 ENCOUNTER — Encounter (HOSPITAL_COMMUNITY): Payer: Self-pay | Admitting: Orthopaedic Surgery

## 2019-01-01 DIAGNOSIS — M1611 Unilateral primary osteoarthritis, right hip: Secondary | ICD-10-CM | POA: Diagnosis not present

## 2019-01-01 MED ORDER — ASPIRIN EC 81 MG PO TBEC: 81.0000 mg | DELAYED_RELEASE_TABLET | Freq: Two times a day (BID) | ORAL | 0 refills | Status: DC

## 2019-01-01 MED ORDER — ALUM & MAG HYDROXIDE-SIMETH 200-200-20 MG/5ML PO SUSP
30.0000 mL | ORAL | Status: DC | PRN
  Administered 2019-01-01 (×2): 30 mL via ORAL
  Filled 2019-01-01 (×3): qty 30

## 2019-01-01 MED ORDER — TIZANIDINE HCL 2 MG PO TABS: 2.0000 mg | ORAL_TABLET | Freq: Four times a day (QID) | ORAL | 1 refills | Status: AC | PRN

## 2019-01-01 MED ORDER — HYDROCODONE-ACETAMINOPHEN 5-325 MG PO TABS: 1.0000 | ORAL_TABLET | Freq: Four times a day (QID) | ORAL | 0 refills | Status: DC | PRN

## 2019-01-01 NOTE — Progress Notes (Addendum)
Physical Therapy Treatment Patient Details Name: Caitlin Logan MRN: [id number] DOB: 02/23/1941 Today's Date: 01/01/2019    History of Present Illness 78 yo female s/p R DA-THA on 12/31/18. PMH includes OA, CAD s/p stenting, dyspnea, HTN, L THA.    PT Comments    Pt continues to report significant groin pain mostly at rest and with transition movement (supine>sit, sit>stand). Pt reports some relief with standing and ambulation. Explained to pt that it may take some time for that groin pain to resolve. Pt stated plan is for d/[facility] to rehab [facility]. Will plan to have a 2nd session , if possible, prior to d/c.   Addendum: Spoke with Renee who stated plan is for pt to d/c back to her Ind Living apt with HHPT f/u.  So will absolutely have a 2nd session to assess pt's readiness to d/c home on today. May need to consider having pt remain in hospital another night to continue to work on pain control and mobility.     Follow Up Recommendations  Follow surgeon's recommendation for DC plan and follow-up therapies     Equipment Recommendations  (TBD at next venue)    Recommendations for Other Services       Precautions / Restrictions Precautions Precautions: Fall Restrictions Weight Bearing Restrictions: No Other Position/Activity Restrictions: WBAT    Mobility  Bed Mobility Overal bed mobility: Needs Assistance Bed Mobility: Supine to Sit     Supine to sit: Min assist;HOB elevated     General bed mobility comments: Assist for R LE. Increased time. Cues for safety, technique.  Transfers Overall transfer level: Needs assistance Equipment used: Rolling walker (2 wheeled) Transfers: Sit to/from Stand Sit to Stand: From elevated surface;Min assist         General transfer comment: Assist to steady until pain lessened. VCs safety, technique, hand/LE placement.  Ambulation/Gait Ambulation/Gait assistance: Min guard Gait Distance (Feet): 150 Feet Assistive device:  Rolling walker (2 wheeled) Gait Pattern/deviations: Step-through pattern;Decreased stride length;Antalgic     General Gait Details: Close guard for safety. Followed with recliner but pt did not need it. Slow gait speed with pt stretching R LE intermittently along the way.   Stairs             Wheelchair Mobility    Modified Rankin (Stroke Patients Only)       Balance                                            Cognition Arousal/Alertness: Awake/alert Behavior During Therapy: WFL for tasks assessed/performed Overall Cognitive Status: Within Functional Limits for tasks assessed                                        Exercises Total Joint Exercises Ankle Circles/Pumps: AROM;Both;10 reps;Supine Quad Sets: AROM;Both;10 reps;Supine Heel Slides: AAROM;Right;5 reps;Supine    General Comments        Pertinent Vitals/Pain Pain Assessment: 0-10 Pain Score: 8  Pain Location: R hip and groin Pain Descriptors / Indicators: Sore;Discomfort;Grimacing;Guarding Pain Intervention(s): Limited activity within patient's tolerance;Monitored during session;Ice applied;Repositioned    Home Living                      Prior Function  PT Goals (current goals can now be found in the care plan section) Progress towards PT goals: Progressing toward goals    Frequency    7X/week      PT Plan Current plan remains appropriate    Co-evaluation              AM-PAC PT "6 Clicks" Mobility   Outcome Measure  Help needed turning from your back to your side while in a flat bed without using bedrails?: A Little Help needed moving from lying on your back to sitting on the side of a flat bed without using bedrails?: A Little Help needed moving to and from a bed to a chair (including a wheelchair)?: A Little Help needed standing up from a chair using your arms (e.g., wheelchair or bedside chair)?: A Little Help needed to walk  in hospital room?: A Little Help needed climbing 3-5 steps with a railing? : A Lot 6 Click Score: 17    End of Session Equipment Utilized During Treatment: Gait belt Activity Tolerance: Patient limited by pain Patient left: in chair;with call bell/phone within reach   PT Visit Diagnosis: Other abnormalities of gait and mobility (R26.89);Pain Pain - Right/Left: Right Pain - part of body: Hip     Time: 3825-0539 PT Time Calculation (min) (ACUTE ONLY): 27 min  Charges:  $Gait Training: 8-22 mins $Therapeutic Exercise: 8-22 mins                        Weston Anna, PT Acute Rehabilitation Services Pager: [phone] Office: [phone]

## 2019-01-01 NOTE — Care Plan (Signed)
Spoke with patient, hospital CM, PT and admissions at SNF at [place]. Patient has pre covered days that she can use at the SNF at [facility] as a resident of the [place]. She will discharge to [facility] for PT there prior to returning to her apartment. [facility] will provide transportation. She is going to stay tonight for pain control and discharge to the facility in the am. She will received therapy there.  Please contact Luellen Pucker at [phone] to arrange transportation in the am.   Thank you   Ladell Heads, [place]

## 2019-01-01 NOTE — Progress Notes (Signed)
Subjective: 1 Day Post-Op Procedure(s) (LRB): Right Anterior Hip Arthroplasty (Right)   Patient resting well and eating breakfast in bed.  Activity level:  wbat Diet tolerance:  ok Voiding:  Foley out this morning Patient reports pain as mild.    Objective: Vital signs in last 24 hours: Temp:  [96.5 F (35.8 C)-97.9 F (36.6 C)] 97.5 F (36.4 C) (08/19 0456) Pulse Rate:  [56-75] 68 (08/19 0456) Resp:  [8-19] 16 (08/19 0456) BP: (81-193)/(42-82) 144/75 (08/19 0456) SpO2:  [92 %-100 %] 99 % (08/19 0456)  Labs: No results for input(s): HGB in the last 72 hours. No results for input(s): WBC, RBC, HCT, PLT in the last 72 hours. No results for input(s): NA, K, CL, CO2, BUN, CREATININE, GLUCOSE, CALCIUM in the last 72 hours. No results for input(s): LABPT, INR in the last 72 hours.  Physical Exam:  Neurologically intact ABD soft Neurovascular intact Sensation intact distally Intact pulses distally Dorsiflexion/Plantar flexion intact Incision: dressing C/D/I and no drainage No cellulitis present Compartment soft  Assessment/Plan:  1 Day Post-Op Procedure(s) (LRB): Right Anterior Hip Arthroplasty (Right) Advance diet Up with therapy D/C IV fluids Discharge home with home health today after PT. Follow up in office 2 weeks post op. Continue on ASA 81mg  BID x 4 weeks post op.  Larwance Sachs Ata Pecha 01/01/2019, 7:54 AM

## 2019-01-01 NOTE — Progress Notes (Signed)
Physical Therapy Treatment Patient Details Name: Caitlin Logan MRN: [id number] DOB: Oct 12, 1940 Today's Date: 01/01/2019    History of Present Illness 78 yo female s/p R DA-THA on 12/31/18. PMH includes OA, CAD s/p stenting, dyspnea, HTN, L THA.    PT Comments    Progressing with mobility. Still some pain issues with transitional movements but seems improved compared to this a.m. Will continue to follow. Plan is for d/c to SNF on tomorrow.    Follow Up Recommendations  Follow surgeon's recommendation for DC plan and follow-up therapies     Equipment Recommendations  (TBD at next venue)    Recommendations for Other Services       Precautions / Restrictions Precautions Precautions: Fall Restrictions Weight Bearing Restrictions: No Other Position/Activity Restrictions: WBAT    Mobility  Bed Mobility Overal bed mobility: Needs Assistance Bed Mobility: Supine to Sit          General bed mobility comments: oob in recliner  Transfers Overall transfer level: Needs assistance Equipment used: Rolling walker (2 wheeled) Transfers: Sit to/from Stand Sit to Stand: Min assist         General transfer comment: Assist to steady until pain lessened. VCs safety, technique, hand/LE placement.  Ambulation/Gait Ambulation/Gait assistance: Min guard Gait Distance (Feet): 165 Feet Assistive device: Rolling walker (2 wheeled) Gait Pattern/deviations: Step-through pattern;Decreased stride length     General Gait Details: Close guard for safety.Slow gait speed with pt stretching R LE intermittently along the way.   Stairs             Wheelchair Mobility    Modified Rankin (Stroke Patients Only)       Balance Overall balance assessment: Mild deficits observed, not formally tested                                          Cognition Arousal/Alertness: Awake/alert Behavior During Therapy: WFL for tasks assessed/performed Overall Cognitive Status:  Within Functional Limits for tasks assessed                                        Exercises Total Joint Exercises Hip ABduction/ADduction: AROM;Right;10 reps;Standing Knee Flexion: AROM;Right;10 reps;Standing Marching in Standing: AROM;Right;Left;10 reps;Standing General Exercises - Lower Extremity Heel Raises: AROM;Both;10 reps;Standing    General Comments        Pertinent Vitals/Pain Pain Assessment: 0-10 Pain Score: 6  Pain Location: R hip and groin Pain Descriptors / Indicators: Sore;Discomfort;Grimacing;Guarding Pain Intervention(s): Monitored during session;Ice applied;Repositioned    Home Living                      Prior Function            PT Goals (current goals can now be found in the care plan section) Progress towards PT goals: Progressing toward goals    Frequency    7X/week      PT Plan Current plan remains appropriate    Co-evaluation              AM-PAC PT "6 Clicks" Mobility   Outcome Measure  Help needed turning from your back to your side while in a flat bed without using bedrails?: A Little Help needed moving from lying on your back to sitting on the side of a flat bed  without using bedrails?: A Little Help needed moving to and from a bed to a chair (including a wheelchair)?: A Little Help needed standing up from a chair using your arms (e.g., wheelchair or bedside chair)?: A Little Help needed to walk in hospital room?: A Little Help needed climbing 3-5 steps with a railing? : A Lot 6 Click Score: 17    End of Session Equipment Utilized During Treatment: Gait belt Activity Tolerance: Patient tolerated treatment well(still some pain issues but better than this am) Patient left: in chair;with call bell/phone within reach   PT Visit Diagnosis: Other abnormalities of gait and mobility (R26.89);Pain Pain - Right/Left: Right Pain - part of body: Hip     Time: [id number] PT Time Calculation (min) (ACUTE  ONLY): 27 min  Charges:  $Gait Training: 8-22 mins $Therapeutic Exercise: 8-22 mins                        Rebeca AlertJannie Tanika Bracco, PT Acute Rehabilitation Services Pager: [phone] Office: [phone]

## 2019-01-01 NOTE — Anesthesia Postprocedure Evaluation (Signed)
Anesthesia Post Note  Patient: Caitlin Logan  Procedure(s) Performed: Right Anterior Hip Arthroplasty (Right Hip)     Patient location during evaluation: PACU Anesthesia Type: Spinal Level of consciousness: awake and alert Pain management: pain level controlled Vital Signs Assessment: post-procedure vital signs reviewed and stable Respiratory status: spontaneous breathing and respiratory function stable Cardiovascular status: blood pressure returned to baseline and stable Postop Assessment: no headache, no backache, spinal receding and no apparent nausea or vomiting Anesthetic complications: no    Last Vitals:  Vitals:   01/01/19 0103 01/01/19 0456  BP: (!) 123/56 (!) 144/75  Pulse: 63 68  Resp: 14 16  Temp: (!) 36.4 C (!) 36.4 C  SpO2: 99% 99%    Last Pain:  Vitals:   01/01/19 0625  TempSrc:   PainSc: 2    Pain Goal: Patients Stated Pain Goal: 2 (01/01/19 0625)                 Montez Hageman

## 2019-01-01 NOTE — TOC Transition Note (Addendum)
Transition of Care [facility]) - CM/SW Discharge Note   Patient Details  Name: Caitlin Logan MRN: [id number] Date of Birth: 1940-11-03  Transition of Care [facility]) CM/SW Contact:  Leeroy Cha, RN Phone Number: 01/01/2019, 10:11 AM   Clinical Narrative:    Physical therapy per Luellen Pucker at [facility] will done by their therapist.  tcf-Renee-pt will goto snf at [facility]-around 3 pom today, paSSAR AND FL2 OBTAINED AND SENT TO FACILITY. Discharge cancelled [facility] not to transport patient until further notice. Final next level of care: [place] Barriers to Discharge: No Barriers Identified   Patient Goals and CMS Choice Patient states their goals for this hospitalization and ongoing recovery are:: to go back to [facility] CMS Medicare.gov Compare Post Acute Care list provided to:: Patient Choice offered to / list presented to : NA  Discharge Placement                       Discharge Plan and Services   Discharge Planning Services: CM Consult Post Acute Care Choice: Home Health          DME Arranged: Bedside commode, Walker rolling DME Agency: [employer]       HH Arranged: PT [place] Agency: Other - See comment(patient will recieve Therapy at the [facility] community)        Social Determinants of Health (SDOH) Interventions     Readmission Risk Interventions No flowsheet data found.

## 2019-01-01 NOTE — Discharge Summary (Addendum)
Patient ID: Caitlin Logan MRN: [id number] DOB/AGE: 1941-03-13 78 y.o.  Admit date: 12/31/2018 Discharge date: 01/02/2019  Admission Diagnoses:  Principal Problem:   Primary osteoarthritis of right hip   Discharge Diagnoses:  Same  Past Medical History:  Diagnosis Date  . Arthritis   . Complication of anesthesia    Trouble voiding  after  . Coronary artery disease   . Dyspnea    with activity  . Family history of adverse reaction to anesthesia    sister is hard to wake up and violent vomiting  . Heart murmur   . Hypertension     Surgeries: Procedure(s): Right Anterior Hip Arthroplasty on 12/31/2018   Consultants:   Discharged Condition: Improved  Hospital Course: Caitlin Logan is an 78 y.o. female who was admitted 12/31/2018 for operative treatment ofPrimary osteoarthritis of right hip. Patient has severe unremitting pain that affects sleep, daily activities, and work/hobbies. After pre-op clearance the patient was taken to the operating room on 12/31/2018 and underwent  Procedure(s): Right Anterior Hip Arthroplasty.    Patient was given perioperative antibiotics:  Anti-infectives (From admission, onward)   Start     Dose/Rate Route Frequency Ordered Stop   12/31/18 1330  ceFAZolin (ANCEF) IVPB 2g/100 mL premix     2 g 200 mL/hr over 30 Minutes Intravenous Every 6 hours 12/31/18 1226 12/31/18 2045   12/31/18 0600  ceFAZolin (ANCEF) IVPB 2g/100 mL premix     2 g 200 mL/hr over 30 Minutes Intravenous On call to O.R. 12/31/18 0530 12/31/18 0740   12/31/18 0534  ceFAZolin (ANCEF) 2-4 GM/100ML-% IVPB    Note to Pharmacy: Vevelyn Royals  : cabinet override      12/31/18 0534 12/31/18 0740       Patient was given sequential compression devices, early ambulation, and chemoprophylaxis to prevent DVT.  Patient benefited maximally from hospital stay and there were no complications.    Recent vital signs:  Patient Vitals for the past 24 hrs:  BP Temp Temp src Pulse Resp  SpO2  01/01/19 0456 (!) 144/75 (!) 97.5 F (36.4 C) Oral 68 16 99 %  01/01/19 0103 (!) 123/56 (!) 97.5 F (36.4 C) Oral 63 14 99 %  12/31/18 2038 (!) 155/62 97.9 F (36.6 C) Oral 65 16 96 %  12/31/18 1752 (!) 151/61 (!) 96.5 F (35.8 C) - (!) 59 18 92 %  12/31/18 1644 (!) 193/74 - - 67 18 100 %  12/31/18 1319 (!) 156/54 - - 61 19 100 %  12/31/18 1223 140/82 - - 60 17 100 %  12/31/18 1200 (!) 150/62 (!) 97.2 F (36.2 C) - (!) 56 13 100 %  12/31/18 1145 - - - 62 15 100 %  12/31/18 1100 (!) 173/61 (!) 97 F (36.1 C) - 62 14 100 %  12/31/18 1000 133/68 (!) 97.2 F (36.2 C) - 71 18 100 %  12/31/18 0945 124/60 - - 75 12 99 %  12/31/18 0935 (!) 124/59 - - - - -  12/31/18 0930 (!) 122/54 - - 64 (!) 8 100 %  12/31/18 0928 (!) 135/55 - - - - -  12/31/18 0926 130/61 - - - - -  12/31/18 0924 (!) 99/50 - - - - -  12/31/18 0922 (!) 83/44 - - - - -  12/31/18 0920 (!) 81/42 (!) 97.5 F (36.4 C) - 61 11 96 %     Recent laboratory studies: No results for input(s): WBC, HGB, HCT, PLT, NA, K, CL, CO2,  BUN, CREATININE, GLUCOSE, INR, CALCIUM in the last 72 hours.  Invalid input(s): PT, 2   Discharge Medications:   Allergies as of 01/01/2019   No Known Allergies     Medication List    TAKE these medications   acetaminophen 500 MG tablet Commonly known as: TYLENOL Take 500 mg by mouth daily as needed for moderate pain.   amLODipine 5 MG tablet Commonly known as: NORVASC Take 2.5 mg by mouth 2 (two) times daily.   aspirin EC 81 MG tablet Take 1 tablet (81 mg total) by mouth 2 (two) times daily after a meal. What changed: when to take this   calcium carbonate 1500 (600 Ca) MG Tabs tablet Commonly known as: OSCAL Take 600 mg of elemental calcium by mouth daily.   HYDROcodone-acetaminophen 5-325 MG tablet Commonly known as: NORCO/VICODIN Take 1-2 tablets by mouth every 6 (six) hours as needed for moderate pain (pain score 4-6).   levothyroxine 88 MCG tablet Commonly known as:  SYNTHROID Take 88 mcg by mouth at bedtime.   lisinopril 40 MG tablet Commonly known as: ZESTRIL Take 40 mg by mouth daily.   metoprolol succinate 25 MG 24 hr tablet Commonly known as: TOPROL-XL Take 12.5 mg by mouth 2 (two) times daily.   pravastatin 20 MG tablet Commonly known as: PRAVACHOL Take 20 mg by mouth at bedtime.   tiZANidine 2 MG tablet Commonly known as: Zanaflex Take 1-2 tablets (2-4 mg total) by mouth every 6 (six) hours as needed.            Durable Medical Equipment  (From admission, onward)         Start     Ordered   12/31/18 1227  DME Walker rolling  Once    Question:  Patient needs a walker to treat with the following condition  Answer:  Primary osteoarthritis of right hip   12/31/18 1226   12/31/18 1227  DME 3 n 1  Once     12/31/18 1226   12/31/18 1227  DME Bedside commode  Once    Question:  Patient needs a bedside commode to treat with the following condition  Answer:  Primary osteoarthritis of right hip   12/31/18 1226          Diagnostic Studies: Dg Chest 2 View  Result Date: 12/25/2018 CLINICAL DATA:  Hypertension EXAM: CHEST - 2 VIEW COMPARISON:  None. FINDINGS: Heart size is normal. Vascular calcifications are noted of the thoracic aorta. There is no pneumothorax or large pleural effusion. There is no focal infiltrate. There is no acute osseous abnormality. IMPRESSION: No active cardiopulmonary disease. Electronically Signed   By: Katherine Mantlehristopher  Green M.D.   On: 12/25/2018 17:57   Dg C-arm 1-60 Min-no Report  Result Date: 12/31/2018 Fluoroscopy was utilized by the requesting physician.  No radiographic interpretation.   Dg Hip Operative Unilat W Or W/o Pelvis Right  Result Date: 12/31/2018 CLINICAL DATA:  Right hip replacement EXAM: OPERATIVE RIGHT HIP (WITH PELVIS IF PERFORMED) 2 VIEWS TECHNIQUE: Fluoroscopic spot image(s) were submitted for interpretation post-operatively. COMPARISON:  None. FINDINGS: Components of right hip  arthroplasty project in expected location. No evident fracture or dislocation. Osteitis pubis. Left hip arthroplasty components partially visualized. IMPRESSION: Right hip arthroplasty without apparent complication. Electronically Signed   By: Corlis Leak  Hassell M.D.   On: 12/31/2018 09:10    Disposition: Discharge disposition: 01-Home or Self Care       Discharge Instructions    Call MD / Call 911  Complete by: As directed    If you experience chest pain or shortness of breath, CALL 911 and be transported to the hospital emergency room.  If you develope a fever above 101 F, pus (white drainage) or increased drainage or redness at the wound, or calf pain, call your surgeon's office.   Constipation Prevention   Complete by: As directed    Drink plenty of fluids.  Prune juice may be helpful.  You may use a stool softener, such as Colace (over the counter) 100 mg twice a day.  Use MiraLax (over the counter) for constipation as needed.   Diet - low sodium heart healthy   Complete by: As directed    Discharge instructions   Complete by: As directed    INSTRUCTIONS AFTER JOINT REPLACEMENT   Remove items at home which could result in a fall. This includes throw rugs or furniture in walking pathways ICE to the affected joint every three hours while awake for 30 minutes at a time, for at least the first 3-5 days, and then as needed for pain and swelling.  Continue to use ice for pain and swelling. You may notice swelling that will progress down to the foot and ankle.  This is normal after surgery.  Elevate your leg when you are not up walking on it.   Continue to use the breathing machine you got in the hospital (incentive spirometer) which will help keep your temperature down.  It is common for your temperature to cycle up and down following surgery, especially at night when you are not up moving around and exerting yourself.  The breathing machine keeps your lungs expanded and your temperature  down.   DIET:  As you were doing prior to hospitalization, we recommend a well-balanced diet.  DRESSING / WOUND CARE / SHOWERING  You may shower 3 days after surgery, but keep the wounds dry during showering.  You may use an occlusive plastic wrap (Press'n Seal for example), NO SOAKING/SUBMERGING IN THE BATHTUB.  If the bandage gets wet, change with a clean dry gauze.  If the incision gets wet, pat the wound dry with a clean towel.  ACTIVITY  Increase activity slowly as tolerated, but follow the weight bearing instructions below.   No driving for 6 weeks or until further direction given by your physician.  You cannot drive while taking narcotics.  No lifting or carrying greater than 10 lbs. until further directed by your surgeon. Avoid periods of inactivity such as sitting longer than an hour when not asleep. This helps prevent blood clots.  You may return to work once you are authorized by your doctor.     WEIGHT BEARING   Weight bearing as tolerated with assist device (walker, cane, etc) as directed, use it as long as suggested by your surgeon or therapist, typically at least 4-6 weeks.   EXERCISES  Results after joint replacement surgery are often greatly improved when you follow the exercise, range of motion and muscle strengthening exercises prescribed by your doctor. Safety measures are also important to protect the joint from further injury. Any time any of these exercises cause you to have increased pain or swelling, decrease what you are doing until you are comfortable again and then slowly increase them. If you have problems or questions, call your caregiver or physical therapist for advice.   Rehabilitation is important following a joint replacement. After just a few days of immobilization, the muscles of the leg can become weakened and  shrink (atrophy).  These exercises are designed to build up the tone and strength of the thigh and leg muscles and to improve motion. Often  times heat used for twenty to thirty minutes before working out will loosen up your tissues and help with improving the range of motion but do not use heat for the first two weeks following surgery (sometimes heat can increase post-operative swelling).   These exercises can be done on a training (exercise) mat, on the floor, on a table or on a bed. Use whatever works the best and is most comfortable for you.    Use music or television while you are exercising so that the exercises are a pleasant break in your day. This will make your life better with the exercises acting as a break in your routine that you can look forward to.   Perform all exercises about fifteen times, three times per day or as directed.  You should exercise both the operative leg and the other leg as well.   Exercises include:   Quad Sets - Tighten up the muscle on the front of the thigh (Quad) and hold for 5-10 seconds.   Straight Leg Raises - With your knee straight (if you were given a brace, keep it on), lift the leg to 60 degrees, hold for 3 seconds, and slowly lower the leg.  Perform this exercise against resistance later as your leg gets stronger.  Leg Slides: Lying on your back, slowly slide your foot toward your buttocks, bending your knee up off the floor (only go as far as is comfortable). Then slowly slide your foot back down until your leg is flat on the floor again.  Angel Wings: Lying on your back spread your legs to the side as far apart as you can without causing discomfort.  Hamstring Strength:  Lying on your back, push your heel against the floor with your leg straight by tightening up the muscles of your buttocks.  Repeat, but this time bend your knee to a comfortable angle, and push your heel against the floor.  You may put a pillow under the heel to make it more comfortable if necessary.   A rehabilitation program following joint replacement surgery can speed recovery and prevent re-injury in the future due to  weakened muscles. Contact your doctor or a physical therapist for more information on knee rehabilitation.    CONSTIPATION  Constipation is defined medically as fewer than three stools per week and severe constipation as less than one stool per week.  Even if you have a regular bowel pattern at home, your normal regimen is likely to be disrupted due to multiple reasons following surgery.  Combination of anesthesia, postoperative narcotics, change in appetite and fluid intake all can affect your bowels.   YOU MUST use at least one of the following options; they are listed in order of increasing strength to get the job done.  They are all available over the counter, and you may need to use some, POSSIBLY even all of these options:    Drink plenty of fluids (prune juice may be helpful) and high fiber foods Colace 100 mg by mouth twice a day  Senokot for constipation as directed and as needed Dulcolax (bisacodyl), take with full glass of water  Miralax (polyethylene glycol) once or twice a day as needed.  If you have tried all these things and are unable to have a bowel movement in the first 3-4 days after surgery call either your  surgeon or your primary doctor.    If you experience loose stools or diarrhea, hold the medications until you stool forms back up.  If your symptoms do not get better within 1 week or if they get worse, check with your doctor.  If you experience "the worst abdominal pain ever" or develop nausea or vomiting, please contact the office immediately for further recommendations for treatment.   ITCHING:  If you experience itching with your medications, try taking only a single pain pill, or even half a pain pill at a time.  You can also use Benadryl over the counter for itching or also to help with sleep.   TED HOSE STOCKINGS:  Use stockings on both legs until for at least 2 weeks or as directed by physician office. They may be removed at night for sleeping.  MEDICATIONS:  See  your medication summary on the "After Visit Summary" that nursing will review with you.  You may have some home medications which will be placed on hold until you complete the course of blood thinner medication.  It is important for you to complete the blood thinner medication as prescribed.  PRECAUTIONS:  If you experience chest pain or shortness of breath - call 911 immediately for transfer to the hospital emergency department.   If you develop a fever greater that 101 F, purulent drainage from wound, increased redness or drainage from wound, foul odor from the wound/dressing, or calf pain - CONTACT YOUR SURGEON.                                                   FOLLOW-UP APPOINTMENTS:  If you do not already have a post-op appointment, please call the office for an appointment to be seen by your surgeon.  Guidelines for how soon to be seen are listed in your "After Visit Summary", but are typically between 1-4 weeks after surgery.  OTHER INSTRUCTIONS:   Knee Replacement:  Do not place pillow under knee, focus on keeping the knee straight while resting. CPM instructions: 0-90 degrees, 2 hours in the morning, 2 hours in the afternoon, and 2 hours in the evening. Place foam block, curve side up under heel at all times except when in CPM or when walking.  DO NOT modify, tear, cut, or change the foam block in any way.  MAKE SURE YOU:  Understand these instructions.  Get help right away if you are not doing well or get worse.    Thank you for letting us be a part of your medical care team.  It is a privilege we respect greatly.  We hope these instructions will help you stay on track for a fast and full recovery!   Increase activity slowly as tolerated   Complete by: As directed       Follow-up Information    Marcene Corningalldorf, Peter, MD. Go on 01/09/2019.   Specialty: Orthopedic Surgery Why: Your appointment has been scheduled for 10:00 Contact information: [address] [place]  [id number] [phone]        [facility] Follow up.   Why: Your therapy will be provided in your retirement community by the staff at [facility].  Contact information: [address] [place]             Signed: Ginger Organndrew Paul Cyriah Childrey 01/01/2019, 7:59 AM

## 2019-01-02 DIAGNOSIS — M1611 Unilateral primary osteoarthritis, right hip: Secondary | ICD-10-CM | POA: Diagnosis not present

## 2019-01-02 MED ORDER — HYDROCODONE-ACETAMINOPHEN 5-325 MG PO TABS: 1.0000 | ORAL_TABLET | Freq: Four times a day (QID) | ORAL | 0 refills | Status: DC | PRN

## 2019-01-02 NOTE — Plan of Care (Signed)
Pt ready to transition to [facility] for rehab.

## 2019-01-02 NOTE — Progress Notes (Addendum)
Physical Therapy Treatment Patient Details Name: Caitlin Logan MRN: [id number] DOB: 10/01/1940 Today's Date: 01/02/2019    History of Present Illness 78 yo female s/p R DA-THA on 12/31/18. PMH includes OA, CAD s/p stenting, dyspnea, HTN, L THA.    PT Comments    POD # 2 am session Assisted OOB.  Demonstrated and instructed pt how to use belt to self assist LE as a leg lifter Assisted with mobility.  General transfer comment: Assist to steady until pain lessened. VCs safety, technique, hand/LE placement. General Gait Details: Close guard for safety.Slow gait speed with pt stretching R LE intermittently along the way.  Then returned to room to perform some TE's following HEP handout.  Instructed on proper tech, freq as well as use of ICE.   Addressed all mobility questions, discussed appropriate activity, educated on use of ICE.  Pt ready for D/C to home.   Follow Up Recommendations  Follow surgeon's recommendation for DC plan and follow-up therapies     Equipment Recommendations       Recommendations for Other Services       Precautions / Restrictions Precautions Precautions: Fall Restrictions Weight Bearing Restrictions: No Other Position/Activity Restrictions: WBAT    Mobility  Bed Mobility Overal bed mobility: Needs Assistance Bed Mobility: Supine to Sit     Supine to sit: Min assist;HOB elevated     General bed mobility comments: instructed how to use a belt loop to self assist LE OOB with increased time  Transfers Overall transfer level: Needs assistance Equipment used: Rolling walker (2 wheeled) Transfers: Sit to/from Stand Sit to Stand: Min assist         General transfer comment: Assist to steady until pain lessened. VCs safety, technique, hand/LE placement.  Ambulation/Gait Ambulation/Gait assistance: Min guard Gait Distance (Feet): 85 Feet Assistive device: Rolling walker (2 wheeled) Gait Pattern/deviations: Step-through pattern;Decreased stride  length Gait velocity: decr   General Gait Details: Close guard for safety.Slow gait speed with pt stretching R LE intermittently along the way.   Stairs             Wheelchair Mobility    Modified Rankin (Stroke Patients Only)       Balance                                            Cognition Arousal/Alertness: Awake/alert Behavior During Therapy: WFL for tasks assessed/performed Overall Cognitive Status: Within Functional Limits for tasks assessed                                        Exercises   Total Hip Replacement TE's 10 reps ankle pumps 10 reps knee presses 10 reps heel slides 10 reps SAQ's 10 reps ABD Followed by ICE    General Comments        Pertinent Vitals/Pain      Home Living                      Prior Function            PT Goals (current goals can now be found in the care plan section) Progress towards PT goals: Progressing toward goals    Frequency    7X/week      PT Plan Current plan remains  appropriate    Co-evaluation              AM-PAC PT "6 Clicks" Mobility   Outcome Measure  Help needed turning from your back to your side while in a flat bed without using bedrails?: A Little Help needed moving from lying on your back to sitting on the side of a flat bed without using bedrails?: A Little Help needed moving to and from a bed to a chair (including a wheelchair)?: A Little Help needed standing up from a chair using your arms (e.g., wheelchair or bedside chair)?: A Little Help needed to walk in hospital room?: A Little   6 Click Score: 15    End of Session Equipment Utilized During Treatment: Gait belt Activity Tolerance: Patient tolerated treatment well Patient left: in chair;with call bell/phone within reach   PT Visit Diagnosis: Other abnormalities of gait and mobility (R26.89);Pain Pain - Right/Left: Right Pain - part of body: Hip     Time: 7741-2878 PT  Time Calculation (min) (ACUTE ONLY): 28 min  Charges:  $Gait Training: 8-22 mins                 $therapeutic exercises 8 - 22 mins          Rica Koyanagi  PTA Acute  Rehabilitation Services Pager      [phone] Office      [phone]

## 2019-01-02 NOTE — Progress Notes (Signed)
Subjective: 2 Days Post-Op Procedure(s) (LRB): Right Anterior Hip Arthroplasty (Right)   Patient is feeling much better today and is looking foreward to going back to [facility].  Activity level:  wbat Diet tolerance:  ok Voiding:  ok Patient reports pain as mild.    Objective: Vital signs in last 24 hours: Temp:  [97.9 F (36.6 C)-98.2 F (36.8 C)] 98 F (36.7 C) (08/20 0459) Pulse Rate:  [70-81] 75 (08/20 0459) Resp:  [15-16] 16 (08/20 0459) BP: (135-150)/(52-63) 135/52 (08/20 0459) SpO2:  [91 %-94 %] 91 % (08/20 0459)  Labs: No results for input(s): HGB in the last 72 hours. No results for input(s): WBC, RBC, HCT, PLT in the last 72 hours. No results for input(s): NA, K, CL, CO2, BUN, CREATININE, GLUCOSE, CALCIUM in the last 72 hours. No results for input(s): LABPT, INR in the last 72 hours.  Physical Exam:  Neurologically intact ABD soft Neurovascular intact Sensation intact distally Intact pulses distally Dorsiflexion/Plantar flexion intact Incision: no drainage No cellulitis present Compartment soft  Assessment/Plan:  2 Days Post-Op Procedure(s) (LRB): Right Anterior Hip Arthroplasty (Right) Advance diet Up with therapy  Discharge back to [facility] today. Follow up in office 2 weeks post op. Continue on ASA 81mg  BID x 4 weeks post op.  Larwance Sachs Gilman Olazabal 01/02/2019, 6:58 AM

## 2019-01-02 NOTE — NC FL2 (Signed)
[place] MEDICAID FL2 LEVEL OF CARE SCREENING TOOL     IDENTIFICATION  Patient Name: Caitlin Logan Birthdate: 01/31/1941 Sex: female Admission Date (Current Location): 12/31/2018  [facility] [place] [place]   Facility and Address:  [facility],  [address], [place] [id number]      Provider Number: [id number]  Attending Physician Name and Address:  Marcene Corningalldorf, Peter, MD  Relative Name and Phone Number:       Current Level of Care: Hospital Recommended Level of Care: Skilled Nursing Facility Prior Approval Number:    Date Approved/Denied:   PASRR Number: [id number]  Discharge Plan: SNF    Current Diagnoses: Patient Active Problem List   Diagnosis Date Noted  . Primary osteoarthritis of right hip 12/31/2018    Orientation RESPIRATION BLADDER Height & Weight     Self, Time, Situation, Place  Normal Continent Weight: 62.6 kg Height:  5' (152.4 cm)  BEHAVIORAL SYMPTOMS/MOOD NEUROLOGICAL BOWEL NUTRITION STATUS      Continent Diet(regular)  AMBULATORY STATUS COMMUNICATION OF NEEDS Skin   Extensive Assist(pt x5 weekly) Verbally Normal                       Personal Care Assistance Level of Assistance  Feeding, Bathing, Dressing Bathing Assistance: Limited assistance Feeding assistance: Limited assistance Dressing Assistance: Limited assistance     Functional Limitations Info             SPECIAL CARE FACTORS FREQUENCY                       Contractures Contractures Info: Not present    Additional Factors Info  Code Status Code Status Info: full             Current Medications (01/02/2019):  This is the current hospital active medication list Current Facility-Administered Medications  Medication Dose Route Frequency Provider Last Rate Last Dose  . acetaminophen (TYLENOL) tablet 325-650 mg  325-650 mg Oral Q6H PRN Elodia FlorenceNida, Andrew, PA-C      . alum & mag hydroxide-simeth (MAALOX/MYLANTA) 200-200-20 MG/5ML  suspension 30 mL  30 mL Oral Q4H PRN Marcene Corningalldorf, Peter, MD   30 mL at 01/01/19 2020  . amLODipine (NORVASC) tablet 2.5 mg  2.5 mg Oral BID Elodia Florenceida, Andrew, PA-C   Stopped at 01/02/19 [id number]  . aspirin chewable tablet 81 mg  81 mg Oral BID Elodia Florenceida, Andrew, PA-C   81 mg at 01/02/19 [id number]  . bisacodyl (DULCOLAX) EC tablet 5 mg  5 mg Oral Daily PRN Elodia FlorenceNida, Andrew, PA-C      . diphenhydrAMINE (BENADRYL) 12.5 MG/5ML elixir 12.5-25 mg  12.5-25 mg Oral Q4H PRN Elodia FlorenceNida, Andrew, PA-C      . docusate sodium (COLACE) capsule 100 mg  100 mg Oral BID Elodia Florenceida, Andrew, PA-C   100 mg at 01/02/19 0904  . HYDROcodone-acetaminophen (NORCO) 7.5-325 MG per tablet 1-2 tablet  1-2 tablet Oral Q4H PRN Elodia FlorenceNida, Andrew, PA-C   2 tablet at 01/02/19 0758  . HYDROcodone-acetaminophen (NORCO/VICODIN) 5-325 MG per tablet 1-2 tablet  1-2 tablet Oral Q4H PRN Elodia FlorenceNida, Andrew, PA-C   2 tablet at 01/01/19 1013  . lactated ringers infusion   Intravenous Continuous Elodia Florenceida, Andrew, PA-C 75 mL/hr at 01/01/19 0600    . levothyroxine (SYNTHROID) tablet 88 mcg  88 mcg Oral QHS Elodia Florenceida, Andrew, PA-C   88 mcg at 01/01/19 2250  . lisinopril (ZESTRIL) tablet 40 mg  40 mg Oral Daily Elodia FlorenceNida, Andrew, PA-C  40 mg at 01/02/19 0903  . menthol-cetylpyridinium (CEPACOL) lozenge 3 mg  1 lozenge Oral PRN Loni Dolly, PA-C       Or  . phenol (CHLORASEPTIC) mouth spray 1 spray  1 spray Mouth/Throat PRN Loni Dolly, PA-C      . methocarbamol (ROBAXIN) tablet 500 mg  500 mg Oral Q6H PRN Loni Dolly, PA-C   500 mg at 01/02/19 6578   Or  . methocarbamol (ROBAXIN) 500 mg in dextrose 5 % 50 mL IVPB  500 mg Intravenous Q6H PRN Loni Dolly, PA-C   Stopped at 12/31/18 1123  . metoCLOPramide (REGLAN) tablet 5-10 mg  5-10 mg Oral Q8H PRN Loni Dolly, PA-C       Or  . metoCLOPramide (REGLAN) injection 5-10 mg  5-10 mg Intravenous Q8H PRN Loni Dolly, PA-C   10 mg at 12/31/18 1859  . metoprolol succinate (TOPROL-XL) 24 hr tablet 12.5 mg  12.5 mg Oral BID Loni Dolly, PA-C   12.5 mg at  01/02/19 4696  . morphine 4 MG/ML injection 0.52-1 mg  0.52-1 mg Intravenous Q2H PRN Loni Dolly, PA-C   1 mg at 01/02/19 0818  . ondansetron (ZOFRAN) tablet 4 mg  4 mg Oral Q6H PRN Loni Dolly, PA-C       Or  . ondansetron [facility]) injection 4 mg  4 mg Intravenous Q6H PRN Loni Dolly, PA-C   4 mg at 01/01/19 2211  . pravastatin (PRAVACHOL) tablet 20 mg  20 mg Oral QHS Loni Dolly, PA-C   20 mg at 01/01/19 2250     Discharge Medications: Please see discharge summary for a list of discharge medications.  Relevant Imaging Results:  Relevant Lab Results:   Additional Information [id number]  Leeroy Cha, RN

## 2019-01-02 NOTE — Care Plan (Signed)
Spoke with patient via phone this am. She is still hurting but agreeable to discharge to [facility]. Message left for Xenia and Spoke with Velva Harman, RNCM at the hospital to get signed FL2 in place and arrange transport with [facility]. Will follow as an outpatient.  Updated PA/MD and they are in agreement with plan   Caitlin Logan, [place]

## 2019-01-02 NOTE — TOC Transition Note (Addendum)
Transition of Care [facility]) - CM/SW Discharge Note   Patient Details  Name: Karlita Lichtman MRN: [id number] Date of Birth: 1940-12-13  [facility]) CM/SW Contact:  Leeroy Cha, RN Phone Number: 01/02/2019, 10:20 AM   Clinical Narrative:    Patient dcd to return to [facility]. Clyda Greener called and message left to return call for arrangements. Transfer packet given to RN amy. tcf-Scott at [facility]-confusion as to where patient in [facility] is to go.  Per the RN Sabino Gasser from [id number] is to go to the snf area for therapy.  Scott arranging transport if unable to transport will call back for ptar transport. tcf scott please send by ptar 1159/dc summary and fl2 faxed attention to [facility]. 44 ptar called for transportFinal next level of care: [facility] Barriers to Discharge: No Barriers Identified   Patient Goals and CMS Choice Patient states their goals for this hospitalization and ongoing recovery are:: to go back to [facility] CMS Medicare.gov Compare Post Acute Care list provided to:: Patient Choice offered to / list presented to : NA  Discharge Placement                       Discharge Plan and Services   Discharge Planning Services: CM Consult Post Acute Care Choice: Home Health          DME Arranged: Bedside commode, Walker rolling DME Agency: [employer]       HH Arranged: PT [place] Agency: Other - See comment(patient will recieve Therapy at the [facility] community)        Social Determinants of Health (SDOH) Interventions     Readmission Risk Interventions No flowsheet data found.

## 2019-03-03 NOTE — Progress Notes (Signed)
CARDIOLOGY CONSULT NOTE       Patient ID: Caitlin Logan MRN: [id number] DOB/AGE: 1940/10/29 78 y.o.  Admit date: (Not on file) Referring Physician: Self Primary Physician: Georgann Housekeeper, MD Primary Cardiologist: New Reason for Consultation: CAD  Active Problems:   * No active hospital problems. *   HPI:  78 y.o. with history of HTN, HLD, Hypothyroidism and arthritis with right THR done in August of 2020 by Dr Jerl Santos with no cardiac issues Notes indicate history of CAD with stent as well as renal artery stent Review of records from [facility] indicate angina with DES to mid circumflex 11/29/17. EF normal Echo done 10/17/18 with mild MR and mild AS mean gradient 10 peak 18 mmHg. RA stent placed on left September 10/2017 She has been intolerant to most statins including zocar, lipitor and crestor as well as zetia  LDL 109 Cr 1.33 on lab review  Needs labs and refill on meds.  Children are in [place] Husband passed 8 years ago She is at retirement home [facility] Active and golfs use to ski a lot   ROS All other systems reviewed and negative except as noted above  Past Medical History:  Diagnosis Date  . Aortic regurgitation   . Arthritis   . Atrial septal aneurysm   . Complication of anesthesia    Trouble voiding  after  . Coronary artery disease   . Dyspnea    with activity  . Family history of adverse reaction to anesthesia    sister is hard to wake up and violent vomiting  . Heart murmur   . History of stent insertion of renal artery   . Hyperlipidemia   . Hypertension   . Hypothyroidism   . Interatrial septal aneurysm with PFO   . Leg length discrepancy   . Mild aortic stenosis   . Renal artery stenosis (HCC)     History reviewed. No pertinent family history.  Social History   Socioeconomic History  . Marital status: Widowed    Spouse name: Not on file  . Number of children: Not on file  . Years of education: Not on file  . Highest education  level: Not on file  Occupational History  . Not on file  Social Needs  . Financial resource strain: Not on file  . Food insecurity    Worry: Not on file    Inability: Not on file  . Transportation needs    Medical: Not on file    Non-medical: Not on file  Tobacco Use  . Smoking status: Never Smoker  . Smokeless tobacco: Never Used  Substance and Sexual Activity  . Alcohol use: Yes    Alcohol/week: 1.0 standard drinks    Types: 1 Glasses of wine per week    Comment: 2 x a week  . Drug use: Not Currently  . Sexual activity: Not Currently  Lifestyle  . Physical activity    Days per week: Not on file    Minutes per session: Not on file  . Stress: Not on file  Relationships  . Social Musician on phone: Not on file    Gets together: Not on file    Attends religious service: Not on file    Active member of club or organization: Not on file    Attends meetings of clubs or organizations: Not on file    Relationship status: Not on file  . Intimate partner violence    Fear of current or ex partner:  Not on file    Emotionally abused: Not on file    Physically abused: Not on file    Forced sexual activity: Not on file  Other Topics Concern  . Not on file  Social History Narrative  . Not on file    Past Surgical History:  Procedure Laterality Date  . ABDOMINAL HYSTERECTOMY    . CARDIAC CATHETERIZATION  2019  . CORONARY ANGIOPLASTY WITH STENT PLACEMENT     x1  . JOINT REPLACEMENT     left hip  . RENAL ARTERY STENT    . TONSILLECTOMY    . TOTAL HIP ARTHROPLASTY Right 12/31/2018   Procedure: Right Anterior Hip Arthroplasty;  Surgeon: Melrose Nakayama, MD;  Location: [facility];  Service: Orthopedics;  Laterality: Right;  . TUBAL LIGATION          Physical Exam: Blood pressure 140/60, pulse 75, height 5' (1.524 m), weight 134 lb (60.8 kg), SpO2 99 %.   Affect appropriate Healthy:  appears stated age 37: normal Neck supple with no adenopathy JVP normal no  bruits no thyromegaly Lungs clear with no wheezing and good diaphragmatic motion Heart:  S1/S2 AS  murmur, no rub, gallop or click PMI normal Abdomen: benighn, BS positve, no tenderness, no AAA no bruit.  No HSM or HJR Distal pulses intact with no bruits No edema Neuro non-focal Skin warm and dry Post bilateral THR ;s    Labs:   Lab Results  Component Value Date   WBC 4.8 12/25/2018   HGB 12.2 12/25/2018   HCT 38.9 12/25/2018   MCV 92.4 12/25/2018   PLT 248 12/25/2018   Radiology: No results found.  EKG: 12/25/18 SR rate 63 normal ECG     ASSESSMENT AND PLAN:   1. HTN:  Well controlled.  Continue current medications and low sodium Dash type diet.   2. HLD:  On statin with history of CAD/RAS target LDL 70 less f/u labs with primary 3. Thyroid:  On replacement f/u labs with primary  4. Ortho:  Post right THR recovering well since August  4. CAD: post stent to mid circumflex 11/19/17 ok to use 81 mg ASA stable 5. Mild AS:  F/u echo in June 2021  6. RAS:  Post stenting of left renal 01/18/18 BP ok Cr 1.33 f/u duplex nex September   She has not established with primary yet refilled meds and ordered labs   Signed: Jenkins Rouge 03/06/2019, 10:32 AM

## 2019-03-06 ENCOUNTER — Encounter (INDEPENDENT_AMBULATORY_CARE_PROVIDER_SITE_OTHER): Payer: Self-pay

## 2019-03-06 ENCOUNTER — Encounter: Payer: Self-pay | Admitting: Cardiovascular Disease

## 2019-03-06 ENCOUNTER — Ambulatory Visit (INDEPENDENT_AMBULATORY_CARE_PROVIDER_SITE_OTHER): Payer: Medicare Other | Admitting: Cardiovascular Disease

## 2019-03-06 VITALS — BP 140/60 | HR 75 | Ht 60.0 in | Wt 134.0 lb

## 2019-03-06 DIAGNOSIS — Z9889 Other specified postprocedural states: Secondary | ICD-10-CM | POA: Diagnosis not present

## 2019-03-06 DIAGNOSIS — E785 Hyperlipidemia, unspecified: Secondary | ICD-10-CM

## 2019-03-06 DIAGNOSIS — Z79899 Other long term (current) drug therapy: Secondary | ICD-10-CM | POA: Diagnosis not present

## 2019-03-06 DIAGNOSIS — I350 Nonrheumatic aortic (valve) stenosis: Secondary | ICD-10-CM | POA: Diagnosis not present

## 2019-03-06 MED ORDER — AMLODIPINE BESYLATE 5 MG PO TABS: 2.5000 mg | ORAL_TABLET | Freq: Two times a day (BID) | ORAL | 3 refills | Status: DC

## 2019-03-06 MED ORDER — LEVOTHYROXINE SODIUM 88 MCG PO TABS: 88.0000 ug | ORAL_TABLET | Freq: Every day | ORAL | 3 refills | Status: DC

## 2019-03-06 MED ORDER — METOPROLOL TARTRATE 25 MG PO TABS: 12.5000 mg | ORAL_TABLET | Freq: Two times a day (BID) | ORAL | 3 refills | Status: DC

## 2019-03-06 MED ORDER — LISINOPRIL 40 MG PO TABS: 40.0000 mg | ORAL_TABLET | Freq: Every day | ORAL | 3 refills | Status: DC

## 2019-03-06 MED ORDER — PRAVASTATIN SODIUM 20 MG PO TABS: 20.0000 mg | ORAL_TABLET | Freq: Every day | ORAL | 3 refills | Status: DC

## 2019-03-06 NOTE — Patient Instructions (Addendum)
Medication Instructions:   *If you need a refill on your cardiac medications before your next appointment, please call your pharmacy*  Lab Work: Your physician recommends that you return for lab work in: 1 week for CMET, CBC, TSH, HgbA1c and fasting lipid panel.  If you have labs (blood work) drawn today and your tests are completely normal, you will receive your results only by: Marland Kitchen MyChart Message (if you have MyChart) OR . A paper copy in the mail If you have any lab test that is abnormal or we need to change your treatment, we will call you to review the results.  Testing/Procedures: Your physician has requested that you have an echocardiogram in September 2021. Echocardiography is a painless test that uses sound waves to create images of your heart. It provides your doctor with information about the size and shape of your heart and how well your heart's chambers and valves are working. This procedure takes approximately one hour. There are no restrictions for this procedure.  Your physician has requested that you have a renal artery duplex in September 2021. During this test, an ultrasound is used to evaluate blood flow to the kidneys. Allow one hour for this exam. Do not eat after midnight the day before and avoid carbonated beverages. Take your medications as you usually do.  Follow-Up: At [facility], you and your health needs are our priority.  As part of our continuing mission to provide you with exceptional heart care, we have created designated Provider Care Teams.  These Care Teams include your primary Cardiologist (physician) and Advanced Practice Providers (APPs -  Physician Assistants and Nurse Practitioners) who all work together to provide you with the care you need, when you need it.  Your next appointment:   12 months  The format for your next appointment:   In Person  Provider:   You may see Dr. Johnsie Cancel or one of the following Advanced Practice Providers on your  designated Care Team:    Truitt Merle, NP  Cecilie Kicks, NP  Kathyrn Drown, NP

## 2019-03-11 ENCOUNTER — Other Ambulatory Visit: Payer: Medicare Other

## 2019-03-11 ENCOUNTER — Other Ambulatory Visit: Payer: Self-pay

## 2019-03-11 DIAGNOSIS — I350 Nonrheumatic aortic (valve) stenosis: Secondary | ICD-10-CM

## 2019-03-11 DIAGNOSIS — Z79899 Other long term (current) drug therapy: Secondary | ICD-10-CM

## 2019-03-11 DIAGNOSIS — Z9889 Other specified postprocedural states: Secondary | ICD-10-CM

## 2019-03-11 DIAGNOSIS — E785 Hyperlipidemia, unspecified: Secondary | ICD-10-CM

## 2019-03-12 LAB — CBC WITH DIFFERENTIAL/PLATELET
Basophils Absolute: 0 10*3/uL (ref 0.0–0.2)
Basos: 1 %
EOS (ABSOLUTE): 0.1 10*3/uL (ref 0.0–0.4)
Eos: 2 %
Hematocrit: 33.7 % — ABNORMAL LOW (ref 34.0–46.6)
Hemoglobin: 11.2 g/dL (ref 11.1–15.9)
Immature Grans (Abs): 0 10*3/uL (ref 0.0–0.1)
Immature Granulocytes: 0 %
Lymphocytes Absolute: 1.6 10*3/uL (ref 0.7–3.1)
Lymphs: 41 %
MCH: 29.3 pg (ref 26.6–33.0)
MCHC: 33.2 g/dL (ref 31.5–35.7)
MCV: 88 fL (ref 79–97)
Monocytes Absolute: 0.4 10*3/uL (ref 0.1–0.9)
Monocytes: 9 %
Neutrophils Absolute: 1.8 10*3/uL (ref 1.4–7.0)
Neutrophils: 47 %
Platelets: 240 10*3/uL (ref 150–450)
RBC: 3.82 x10E6/uL (ref 3.77–5.28)
RDW: 13 % (ref 11.7–15.4)
WBC: 3.9 10*3/uL (ref 3.4–10.8)

## 2019-03-12 LAB — TSH: TSH: 0.35 u[IU]/mL — ABNORMAL LOW (ref 0.450–4.500)

## 2019-03-12 LAB — COMPREHENSIVE METABOLIC PANEL
ALT: 33 IU/L — ABNORMAL HIGH (ref 0–32)
AST: 28 IU/L (ref 0–40)
Albumin/Globulin Ratio: 1.8 (ref 1.2–2.2)
Albumin: 4 g/dL (ref 3.7–4.7)
Alkaline Phosphatase: 110 IU/L (ref 39–117)
BUN/Creatinine Ratio: 22 (ref 12–28)
BUN: 28 mg/dL — ABNORMAL HIGH (ref 8–27)
Bilirubin Total: <0.2 mg/dL (ref 0.0–1.2)
CO2: 23 mmol/L (ref 20–29)
Calcium: 9.8 mg/dL (ref 8.7–10.3)
Chloride: 102 mmol/L (ref 96–106)
Creatinine, Ser: 1.27 mg/dL — ABNORMAL HIGH (ref 0.57–1.00)
GFR calc Af Amer: 47 mL/min/{1.73_m2} — ABNORMAL LOW (ref >59)
GFR calc non Af Amer: 41 mL/min/{1.73_m2} — ABNORMAL LOW (ref >59)
Globulin, Total: 2.2 g/dL (ref 1.5–4.5)
Glucose: 89 mg/dL (ref 65–99)
Potassium: 4.5 mmol/L (ref 3.5–5.2)
Sodium: 138 mmol/L (ref 134–144)
Total Protein: 6.2 g/dL (ref 6.0–8.5)

## 2019-03-12 LAB — LIPID PANEL
Chol/HDL Ratio: 2.3 ratio (ref 0.0–4.4)
Cholesterol, Total: 172 mg/dL (ref 100–199)
HDL: 76 mg/dL (ref >39)
LDL Chol Calc (NIH): 83 mg/dL (ref 0–99)
Triglycerides: 68 mg/dL (ref 0–149)
VLDL Cholesterol Cal: 13 mg/dL (ref 5–40)

## 2019-03-12 LAB — HEMOGLOBIN A1C
Est. average glucose Bld gHb Est-mCnc: 108 mg/dL
Hgb A1c MFr Bld: 5.4 % (ref 4.8–5.6)

## 2019-11-06 ENCOUNTER — Other Ambulatory Visit: Payer: Self-pay | Admitting: Internal Medicine

## 2019-11-06 DIAGNOSIS — Z1231 Encounter for screening mammogram for malignant neoplasm of breast: Secondary | ICD-10-CM

## 2019-11-24 ENCOUNTER — Other Ambulatory Visit: Payer: Self-pay

## 2019-11-24 ENCOUNTER — Ambulatory Visit
Admission: RE | Admit: 2019-11-24 | Discharge: 2019-11-24 | Disposition: A | Discharge status: Home / Self Care | Payer: Medicare Other | Source: Ambulatory Visit | Attending: Internal Medicine | Admitting: Internal Medicine

## 2019-11-24 DIAGNOSIS — Z1231 Encounter for screening mammogram for malignant neoplasm of breast: Secondary | ICD-10-CM

## 2020-02-04 ENCOUNTER — Other Ambulatory Visit (HOSPITAL_COMMUNITY): Payer: Self-pay | Admitting: Cardiovascular Disease

## 2020-02-04 ENCOUNTER — Ambulatory Visit (HOSPITAL_BASED_OUTPATIENT_CLINIC_OR_DEPARTMENT_OTHER): Payer: Medicare Other

## 2020-02-04 ENCOUNTER — Other Ambulatory Visit: Payer: Self-pay

## 2020-02-04 ENCOUNTER — Ambulatory Visit (HOSPITAL_COMMUNITY)
Admission: RE | Admit: 2020-02-04 | Discharge: 2020-02-04 | Disposition: A | Discharge status: Home / Self Care | Payer: Medicare Other | Source: Ambulatory Visit | Attending: Cardiovascular Disease | Admitting: Cardiovascular Disease

## 2020-02-04 DIAGNOSIS — I350 Nonrheumatic aortic (valve) stenosis: Secondary | ICD-10-CM | POA: Diagnosis present

## 2020-02-04 DIAGNOSIS — Z9889 Other specified postprocedural states: Secondary | ICD-10-CM | POA: Insufficient documentation

## 2020-02-04 LAB — ECHOCARDIOGRAM COMPLETE
AR max vel: 1.99 cm2
AV Area VTI: 2.11 cm2
AV Area mean vel: 1.92 cm2
AV Mean grad: 12 mmHg
AV Peak grad: 22 mmHg
Ao pk vel: 2.35 m/s
Area-P 1/2: 2.32 cm2
P 1/2 time: 457 msec
S' Lateral: 1.9 cm

## 2020-02-10 ENCOUNTER — Other Ambulatory Visit: Payer: Self-pay

## 2020-03-07 ENCOUNTER — Other Ambulatory Visit: Payer: Self-pay | Admitting: Cardiovascular Disease

## 2020-03-10 ENCOUNTER — Other Ambulatory Visit: Payer: Self-pay | Admitting: Cardiovascular Disease

## 2020-03-11 NOTE — Telephone Encounter (Signed)
Levothyroxine should be refilled by patient's PCP.

## 2020-03-11 NOTE — Telephone Encounter (Signed)
Pt's pharmacy is requesting a refill on levothyroxine. Would Dr. Nishan like to refill this medication? Please address 

## 2020-03-15 MED ORDER — LEVOTHYROXINE SODIUM 88 MCG PO TABS: 88.0000 ug | ORAL_TABLET | Freq: Every day | ORAL | 0 refills | Status: DC

## 2020-05-18 NOTE — Progress Notes (Signed)
CARDIOLOGY CONSULT NOTE       Patient ID: Caitlin Logan MRN: [id number] DOB/AGE: Mar 16, 1941 80 y.o.  Admit date: (Not on file) Referring Physician: Self Primary Physician: Georgann Housekeeper, MD Primary Cardiologist: New Reason for Consultation: CAD  Active Problems:   * No active hospital problems. *   HPI:  80 y.o. with history of HTN, HLD, Hypothyroidism and arthritis with right THR done in August of 2020 by Dr Jerl Santos with no cardiac issues Notes indicate history of CAD with stent as well as renal artery stent Review of records from [facility] indicate angina with DES to mid circumflex 11/29/17. EF normal Echo done 10/17/18 with mild MR and mild AS mean gradient 10 peak 18 mmHg. RA stent placed on left September 10/2017 She has been intolerant to most statins including zocar, lipitor and crestor as well as zetia  LDL 109 Cr 1.33 on lab review  Children are in [place] Husband passed 9 years ago She is at retirement home [facility] Active and golfs use to ski a lot   I got 140 / 80 mmHg for her BP today after being in room a bit Discussed taking her meds more spread out   ROS All other systems reviewed and negative except as noted above  Past Medical History:  Diagnosis Date  . Aortic regurgitation   . Arthritis   . Atrial septal aneurysm   . Complication of anesthesia    Trouble voiding  after  . Coronary artery disease   . Dyspnea    with activity  . Family history of adverse reaction to anesthesia    sister is hard to wake up and violent vomiting  . Heart murmur   . History of stent insertion of renal artery   . Hyperlipidemia   . Hypertension   . Hypothyroidism   . Interatrial septal aneurysm with PFO   . Leg length discrepancy   . Mild aortic stenosis   . Renal artery stenosis (HCC)     No family history on file.  Social History   Socioeconomic History  . Marital status: Widowed    Spouse name: Not on file  . Number of children: Not  on file  . Years of education: Not on file  . Highest education level: Not on file  Occupational History  . Not on file  Tobacco Use  . Smoking status: Never Smoker  . Smokeless tobacco: Never Used  Vaping Use  . Vaping Use: Never used  Substance and Sexual Activity  . Alcohol use: Yes    Alcohol/week: 1.0 standard drink    Types: 1 Glasses of wine per week    Comment: 2 x a week  . Drug use: Not Currently  . Sexual activity: Not Currently  Other Topics Concern  . Not on file  Social History Narrative  . Not on file   Social Determinants of Health   Financial Resource Strain: Not on file  Food Insecurity: Not on file  Transportation Needs: Not on file  Physical Activity: Not on file  Stress: Not on file  Social Connections: Not on file  Intimate Partner Violence: Not on file    Past Surgical History:  Procedure Laterality Date  . ABDOMINAL HYSTERECTOMY    . CARDIAC CATHETERIZATION  2019  . CORONARY ANGIOPLASTY WITH STENT PLACEMENT     x1  . JOINT REPLACEMENT     left hip  . RENAL ARTERY STENT    . TONSILLECTOMY    . TOTAL HIP  ARTHROPLASTY Right 12/31/2018   Procedure: Right Anterior Hip Arthroplasty;  Surgeon: Marcene Corning, MD;  Location: [facility];  Service: Orthopedics;  Laterality: Right;  . TUBAL LIGATION          Physical Exam: There were no vitals taken for this visit.   Affect appropriate Healthy:  appears stated age HEENT: normal Neck supple with no adenopathy JVP normal no bruits no thyromegaly Lungs clear with no wheezing and good diaphragmatic motion Heart:  S1/S2 AS  murmur, no rub, gallop or click PMI normal Abdomen: benighn, BS positve, no tenderness, no AAA no bruit.  No HSM or HJR Distal pulses intact with no bruits No edema Neuro non-focal Skin warm and dry Post bilateral THR ;s    Labs:   Lab Results  Component Value Date   WBC 3.9 03/11/2019   HGB 11.2 03/11/2019   HCT 33.7 (L) 03/11/2019   MCV 88 03/11/2019   PLT 240  03/11/2019   Radiology: No results found.  EKG: 12/25/18 SR rate 63 normal ECG  05/25/2020 NSR rate 64 normal    ASSESSMENT AND PLAN:   1. HTN:  Well controlled.  Continue current medications and low sodium Dash type diet.   2. HLD:  On statin with history of CAD/RAS target LDL 70 less f/u labs with primary 3. Thyroid:  On replacement f/u labs with primary  4. Ortho:  Post right THR improved   4. CAD: post stent to mid circumflex 11/19/17 ok to use 81 mg ASA stable no angina  5. Mild AS:  Mean gradient 12 peak 22 mmHg TTE  02/04/20  6. RAS:  Post stenting of left renal 01/18/18 BP ok Cr 1.33 f/u duplex done September 2021 no restenosis    She has not established with primary yet refilled meds and ordered labs   Signed: Charlton Haws 05/18/2020, 1:16 PM

## 2020-05-25 ENCOUNTER — Encounter: Payer: Self-pay | Admitting: Cardiovascular Disease

## 2020-05-25 ENCOUNTER — Other Ambulatory Visit: Payer: Self-pay

## 2020-05-25 ENCOUNTER — Ambulatory Visit (INDEPENDENT_AMBULATORY_CARE_PROVIDER_SITE_OTHER): Payer: Medicare Other | Admitting: Cardiovascular Disease

## 2020-05-25 VITALS — BP 170/78 | HR 64 | Ht 60.0 in | Wt 146.0 lb

## 2020-05-25 DIAGNOSIS — E785 Hyperlipidemia, unspecified: Secondary | ICD-10-CM | POA: Diagnosis not present

## 2020-05-25 DIAGNOSIS — I350 Nonrheumatic aortic (valve) stenosis: Secondary | ICD-10-CM | POA: Diagnosis not present

## 2020-05-25 NOTE — Patient Instructions (Signed)

## 2020-06-14 ENCOUNTER — Other Ambulatory Visit: Payer: Self-pay | Admitting: Cardiovascular Disease

## 2020-06-16 NOTE — Telephone Encounter (Signed)
Patient's PCP should be able to refill. Dr. Eden Emms filled patient's levothyroxine at her office visit in November, due to patient not having a PCP at that time. Will forward to patient's PCP, Dr. Donette Larry.

## 2020-10-15 ENCOUNTER — Other Ambulatory Visit: Payer: Self-pay | Admitting: Internal Medicine

## 2020-10-15 DIAGNOSIS — Z1231 Encounter for screening mammogram for malignant neoplasm of breast: Secondary | ICD-10-CM

## 2020-11-21 NOTE — Progress Notes (Signed)
CARDIOLOGY CONSULT NOTE      Virtual Visit via Video Note   This visit type was conducted due to national recommendations for restrictions regarding the COVID-19 Pandemic (e.g. social distancing) in an effort to limit this patient's exposure and mitigate transmission in our community.  Due to her co-morbid illnesses, this patient is at least at moderate risk for complications without adequate follow up.  This format is felt to be most appropriate for this patient at this time.  All issues noted in this document were discussed and addressed.  A limited physical exam was performed with this format.  Please refer to the patient's chart for her consent to telehealth for [facility].   Patient location : Home Physician location : Office  Patient ID: Caitlin Logan MRN: [id number] DOB/AGE: 1941/01/13 80 y.o.  Referring Physician: Self Primary Physician: Georgann Housekeeper, MD Primary Cardiologist: Eden Emms   HPI:  80 y.o. with history of HTN, HLD, Hypothyroidism and arthritis with right THR done in August of 2020 by Dr Jerl Santos with no cardiac issues Notes indicate history of CAD with stent as well as renal artery stent Review of records from [facility] indicate angina with DES to mid circumflex 11/29/17. EF normal Echo done 10/17/18 with mild MR and mild AS mean gradient 10 peak 18 mmHg. RA stent placed on left September 10/2017 She has been intolerant to most statins including zocar, lipitor and crestor as well as zetia Able to take pravastatin   TTE 02/04/20 EF 60-65% mild AS mean gradient only 12 mmHg trivial MR   LDL 109 Cr 1.33 on lab review  Children are in [place] Husband passed 9 years ago She is at retirement home [employer] and [employer] use to ski a lot   She has had some dyspnea Hct and TSH ok on recent labs Norvasc increased for BP and statin increased for LDL 121 by primary    ROS All other systems reviewed and negative except as noted above  Past Medical  History:  Diagnosis Date   Aortic regurgitation    Arthritis    Atrial septal aneurysm    Complication of anesthesia    Trouble voiding  after   Coronary artery disease    Dyspnea    with activity   Family history of adverse reaction to anesthesia    sister is hard to wake up and violent vomiting   Heart murmur    History of stent insertion of renal artery    Hyperlipidemia    Hypertension    Hypothyroidism    Interatrial septal aneurysm with PFO    Leg length discrepancy    Mild aortic stenosis    Renal artery stenosis (HCC)     No family history on file.  Social History   Socioeconomic History   Marital status: Widowed    Spouse name: Not on file   Number of children: Not on file   Years of education: Not on file   Highest education level: Not on file  Occupational History   Not on file  Tobacco Use   Smoking status: Never   Smokeless tobacco: Never  Vaping Use   Vaping Use: Never used  Substance and Sexual Activity   Alcohol use: Yes    Alcohol/week: 1.0 standard drink    Types: 1 Glasses of wine per week    Comment: 2 x a week   Drug use: Not Currently   Sexual activity: Not Currently  Other Topics Concern   Not  on file  Social History Narrative   Not on file   Social Determinants of Health   Financial Resource Strain: Not on file  Food Insecurity: Not on file  Transportation Needs: Not on file  Physical Activity: Not on file  Stress: Not on file  Social Connections: Not on file  Intimate Partner Violence: Not on file    Past Surgical History:  Procedure Laterality Date   ABDOMINAL HYSTERECTOMY     CARDIAC CATHETERIZATION  2019   CORONARY ANGIOPLASTY WITH STENT PLACEMENT     x1   JOINT REPLACEMENT     left hip   RENAL ARTERY STENT     TONSILLECTOMY     TOTAL HIP ARTHROPLASTY Right 12/31/2018   Procedure: Right Anterior Hip Arthroplasty;  Surgeon: Marcene Corning, MD;  Location: [facility];  Service: Orthopedics;  Laterality: Right;   TUBAL  LIGATION          Physical Exam: Blood pressure 126/75, pulse 82, height 5' (1.524 m), weight 64.4 kg.   No distress Elderly white female  No tachypnea No edema  No JVP elevation    Labs:   Lab Results  Component Value Date   WBC 3.9 03/11/2019   HGB 11.2 03/11/2019   HCT 33.7 (L) 03/11/2019   MCV 88 03/11/2019   PLT 240 03/11/2019   Radiology: No results found.  EKG: 12/25/18 SR rate 63 normal ECG  12/01/2020 NSR rate 64 normal    ASSESSMENT AND PLAN:   1. HTN:  Well controlled.  Continue current medications and low sodium Dash type diet.  Norvasc dose increased by Dr Rene Paci  2. HLD:  On statin with history of CAD/RAS target LDL 70 less f/u labs with primary 3. Thyroid:  On replacement f/u labs with primary  4. Ortho:  Post right THR improved   4. CAD: post stent to mid circumflex 11/19/17 ok to use 81 mg ASA stable no angina  5. Mild AS:  Mean gradient 12 peak 22 mmHg TTE  02/04/20 will update in September doubt rapid progression that would cause dyspnea  6. RAS:  Post stenting of left renal 01/18/18 BP ok Cr 1.33 f/u duplex done September 2021 no restenosis    Time:  spent reviewing old notes echo , renal US direct patient interview and composing note 20 minutes   F/U October with echo for AS   Signed: Charlton Haws 12/01/2020, 9:03 AM

## 2020-11-22 ENCOUNTER — Encounter: Payer: Self-pay | Admitting: Cardiovascular Disease

## 2020-11-26 ENCOUNTER — Telehealth: Payer: Self-pay

## 2020-11-26 ENCOUNTER — Other Ambulatory Visit: Payer: Self-pay

## 2020-11-26 MED ORDER — PRAVASTATIN SODIUM 40 MG PO TABS: 40.0000 mg | ORAL_TABLET | Freq: Every day | ORAL | 3 refills | Status: AC

## 2020-11-26 NOTE — Telephone Encounter (Signed)
-----   Message from Peter C Nishan, MD sent at 11/25/2020 12:38 PM EDT ----- LDL 121 see if she can tolerate 40 mg Pravastatin and repeat labs 3 months Cr a bit high but stable  

## 2020-11-26 NOTE — Telephone Encounter (Signed)
-----   Message from Wendall Stade, MD sent at 11/25/2020 12:38 PM EDT ----- LDL 121 see if she can tolerate 40 mg Pravastatin and repeat labs 3 months Cr a bit high but stable

## 2020-11-26 NOTE — Telephone Encounter (Signed)
Patient returning call.

## 2020-11-26 NOTE — Telephone Encounter (Signed)
Left message for patient to call back  

## 2020-11-26 NOTE — Telephone Encounter (Signed)
The patient has been notified of the result and verbalized understanding.  All questions (if any) were answered. Cindi Carbon [place], RN 11/26/2020 2:36 PM   Patient stated she will be seeing Dr. Donette Larry in October. Will send message to Dr. Donette Larry to see if he can check patient's Lipids at office visit in October.

## 2020-12-01 ENCOUNTER — Other Ambulatory Visit: Payer: Self-pay

## 2020-12-01 ENCOUNTER — Telehealth (INDEPENDENT_AMBULATORY_CARE_PROVIDER_SITE_OTHER): Payer: Medicare Other | Admitting: Cardiovascular Disease

## 2020-12-01 VITALS — BP 126/75 | HR 82 | Ht 60.0 in | Wt 142.0 lb

## 2020-12-01 DIAGNOSIS — I350 Nonrheumatic aortic (valve) stenosis: Secondary | ICD-10-CM

## 2020-12-01 DIAGNOSIS — I10 Essential (primary) hypertension: Secondary | ICD-10-CM

## 2020-12-01 DIAGNOSIS — I2510 Atherosclerotic heart disease of native coronary artery without angina pectoris: Secondary | ICD-10-CM | POA: Diagnosis not present

## 2020-12-01 DIAGNOSIS — E782 Mixed hyperlipidemia: Secondary | ICD-10-CM

## 2020-12-01 DIAGNOSIS — I701 Atherosclerosis of renal artery: Secondary | ICD-10-CM | POA: Diagnosis not present

## 2020-12-01 NOTE — Patient Instructions (Addendum)
Medication Instructions:  *If you need a refill on your cardiac medications before your next appointment, please call your pharmacy*  Lab Work: If you have labs (blood work) drawn today and your tests are completely normal, you will receive your results only by: MyChart Message (if you have MyChart) OR A paper copy in the mail If you have any lab test that is abnormal or we need to change your treatment, we will call you to review the results.  Testing/Procedures: Your physician has requested that you have an echocardiogram in October. Echocardiography is a painless test that uses sound waves to create images of your heart. It provides your doctor with information about the size and shape of your heart and how well your heart's chambers and valves are working. This procedure takes approximately one hour. There are no restrictions for this procedure.  Follow-Up: At [facility], you and your health needs are our priority.  As part of our continuing mission to provide you with exceptional heart care, we have created designated Provider Care Teams.  These Care Teams include your primary Cardiologist (physician) and Advanced Practice Providers (APPs -  Physician Assistants and Nurse Practitioners) who all work together to provide you with the care you need, when you need it.  We recommend signing up for the patient portal called "MyChart".  Sign up information is provided on this After Visit Summary.  MyChart is used to connect with patients for Virtual Visits (Telemedicine).  Patients are able to view lab/test results, encounter notes, upcoming appointments, etc.  Non-urgent messages can be sent to your provider as well.   To learn more about what you can do with MyChart, go to [email or web address].    Your next appointment:   October  The format for your next appointment:   In Person  Provider:   You may see Dr. Eden Emms or one of the following Advanced Practice Providers on your designated  Care Team:   Nada Boozer, NP

## 2020-12-09 ENCOUNTER — Ambulatory Visit
Admission: RE | Admit: 2020-12-09 | Discharge: 2020-12-09 | Disposition: A | Discharge status: Home / Self Care | Payer: Medicare Other | Source: Ambulatory Visit | Attending: Internal Medicine | Admitting: Internal Medicine

## 2020-12-09 ENCOUNTER — Other Ambulatory Visit: Payer: Self-pay

## 2020-12-09 DIAGNOSIS — Z1231 Encounter for screening mammogram for malignant neoplasm of breast: Secondary | ICD-10-CM

## 2021-01-27 ENCOUNTER — Other Ambulatory Visit (HOSPITAL_COMMUNITY): Payer: Self-pay | Admitting: Cardiovascular Disease

## 2021-01-27 DIAGNOSIS — I701 Atherosclerosis of renal artery: Secondary | ICD-10-CM

## 2021-01-27 DIAGNOSIS — Z9889 Other specified postprocedural states: Secondary | ICD-10-CM

## 2021-02-07 ENCOUNTER — Other Ambulatory Visit: Payer: Self-pay

## 2021-02-07 ENCOUNTER — Other Ambulatory Visit (HOSPITAL_COMMUNITY): Payer: Self-pay | Admitting: Cardiovascular Disease

## 2021-02-07 ENCOUNTER — Ambulatory Visit (HOSPITAL_COMMUNITY)
Admission: RE | Admit: 2021-02-07 | Discharge: 2021-02-07 | Disposition: A | Discharge status: Home / Self Care | Payer: Medicare Other | Source: Ambulatory Visit | Attending: Cardiovascular Disease | Admitting: Cardiovascular Disease

## 2021-02-07 DIAGNOSIS — Z9889 Other specified postprocedural states: Secondary | ICD-10-CM

## 2021-02-07 DIAGNOSIS — I701 Atherosclerosis of renal artery: Secondary | ICD-10-CM | POA: Insufficient documentation

## 2021-02-08 ENCOUNTER — Telehealth: Payer: Self-pay | Admitting: Cardiovascular Disease

## 2021-02-08 NOTE — Telephone Encounter (Signed)
Patient returning call from Dr. Fabio Bering Nurse from yesterday (9.26.22) to discuss test results. Please call back

## 2021-02-08 NOTE — Telephone Encounter (Signed)
The patient has been notified of the renal artery duplex result and verbalized understanding.  All questions (if any) were answered. Sampson Goon, RN 02/08/2021 11:05 AM

## 2021-02-14 NOTE — Progress Notes (Signed)
CARDIOLOGY CONSULT NOTE      Patient ID: Caitlin Logan MRN: [id number] DOB/AGE: [birth year] 80 y.o.  Referring Physician: Self Primary Physician: Georgann Housekeeper, MD Primary Cardiologist: Eden Emms   HPI:  80 y.o. with history of HTN, HLD, Hypothyroidism and arthritis with right THR done in August of 2020 by Dr Jerl Santos with no cardiac issues Notes indicate history of CAD with stent as well as renal artery stent Review of records from [facility] indicate angina with DES to mid circumflex 11/29/17. EF normal Echo done 10/17/18 with mild MR and mild AS mean gradient 10 peak 18 mmHg. RA stent placed on left September 10/2017 She has been intolerant to most statins including zocar, lipitor and crestor as well as zetia Able to take pravastatin   TTE 02/04/20 EF 60-65% mild AS mean gradient only 12 mmHg trivial MR  Renal Duplex 02/07/21 no residual RAS   LDL 109 Cr 1.33 on lab review  Children are in [place] Husband passed 10 years ago She is at retirement home [employer] and [employer] use to ski a lot   Norvasc increased for BP and statin increased for LDL 121 by primary   TTE 02/16/2021 mean gradient unchanged EF normal mild AR/MR :    Had a great trip to [place] with her 2 daughters She did notice exertional dyspnea  She did not have classic angina before her circumflex stent just dyspnea She will need A left TKR with Dr Margreta Journey in January Discussed utility of doing myovue now to clear for surgery given symptoms    ROS All other systems reviewed and negative except as noted above  Past Medical History:  Diagnosis Date   Aortic regurgitation    Arthritis    Atrial septal aneurysm    Complication of anesthesia    Trouble voiding  after   Coronary artery disease    Dyspnea    with activity   Family history of adverse reaction to anesthesia    sister is hard to wake up and violent vomiting   Heart murmur    History of stent insertion of renal artery    Hyperlipidemia     Hypertension    Hypothyroidism    Interatrial septal aneurysm with PFO    Leg length discrepancy    Mild aortic stenosis    Renal artery stenosis (HCC)     No family history on file.  Social History   Socioeconomic History   Marital status: Widowed    Spouse name: Not on file   Number of children: Not on file   Years of education: Not on file   Highest education level: Not on file  Occupational History   Not on file  Tobacco Use   Smoking status: Never   Smokeless tobacco: Never  Vaping Use   Vaping Use: Never used  Substance and Sexual Activity   Alcohol use: Yes    Alcohol/week: 1.0 standard drink    Types: 1 Glasses of wine per week    Comment: 2 x a week   Drug use: Not Currently   Sexual activity: Not Currently  Other Topics Concern   Not on file  Social History Narrative   Not on file   Social Determinants of Health   Financial Resource Strain: Not on file  Food Insecurity: Not on file  Transportation Needs: Not on file  Physical Activity: Not on file  Stress: Not on file  Social Connections: Not on file  Intimate Partner Violence: Not on  file    Past Surgical History:  Procedure Laterality Date   ABDOMINAL HYSTERECTOMY     CARDIAC CATHETERIZATION  2019   CORONARY ANGIOPLASTY WITH STENT PLACEMENT     x1   JOINT REPLACEMENT     left hip   RENAL ARTERY STENT     TONSILLECTOMY     TOTAL HIP ARTHROPLASTY Right 12/31/2018   Procedure: Right Anterior Hip Arthroplasty;  Surgeon: Marcene Corning, MD;  Location: [facility];  Service: Orthopedics;  Laterality: Right;   TUBAL LIGATION          Physical Exam: There were no vitals taken for this visit.   Affect appropriate Healthy:  appears stated age HEENT: normal Neck supple with no adenopathy JVP normal no bruits no thyromegaly Lungs clear with no wheezing and good diaphragmatic motion Heart:  S1/S2 mild AS murmur, no rub, gallop or click PMI normal Abdomen: benighn, BS positve, no tenderness, no  AAA no bruit.  No HSM or HJR Distal pulses intact with no bruits No edema Neuro non-focal Skin warm and dry No muscular weakness    Labs:   Lab Results  Component Value Date   WBC 3.9 03/11/2019   HGB 11.2 03/11/2019   HCT 33.7 (L) 03/11/2019   MCV 88 03/11/2019   PLT 240 03/11/2019   Radiology: VAS US RENAL ARTERY DUPLEX  Result Date: 02/07/2021 ABDOMINAL VISCERAL Patient Name:  Caitlin Logan  Date of Exam:   02/07/2021 Medical Rec #: [id number]         Accession #:    [id number] Date of Birth: 1941-01-15          Patient Gender: F Patient Age:   80 years Exam Location:  [facility] Procedure:      VAS US RENAL ARTERY DUPLEX Referring Phys: [id number] C Shavelle Runkel -------------------------------------------------------------------------------- Indications: Renal artery stenosis, with left renal artery stent placement.              Patient denies any abdominal pain. High Risk Factors: Hypertension, hyperlipidemia, no history of smoking, coronary                    artery disease. Limitations: Air/bowel gas. Comparison Study: In 01/2020, a renal duplex showed evidence of 159/33 cm/s in                   the right proximal renal artery suggesting 1-59% stenosis.                   Patent left renal artery stent without evidence of stenosis.                   Left inferior pole cyst, measuring 2.2 x 1.9 x 1.8 cm. Prior                   labs on 03/11/2019, showed a creatinine of 1.27 and BUN of 28. Performing Technologist: Tyna Jaksch RVT  Examination Guidelines: A complete evaluation includes B-mode imaging, spectral Doppler, color Doppler, and power Doppler as needed of all accessible portions of each vessel. Bilateral testing is considered an integral part of a complete examination. Limited examinations for reoccurring indications may be performed as noted.  Duplex Findings: +----------------------+--------+--------+------+----------------------+ Mesenteric            PSV cm/sEDV cm/sPlaque        Comments        +----------------------+--------+--------+------+----------------------+ Aorta Prox  254                 2.2 cm AP x 2.2 cm TRV +----------------------+--------+--------+------+----------------------+ Aorta Mid               248                                        +----------------------+--------+--------+------+----------------------+ Aorta Distal            208                                        +----------------------+--------+--------+------+----------------------+ Celiac Artery Proximal  169      22               turbulent        +----------------------+--------+--------+------+----------------------+ SMA Proximal            286      0                                 +----------------------+--------+--------+------+----------------------+  Technologist observations/stent(s): Left renal artery stent struts not visualized. Please see native table.  +------------------+--------+--------+-------+ Right Renal ArteryPSV cm/sEDV cm/sComment +------------------+--------+--------+-------+ Origin              241      26           +------------------+--------+--------+-------+ Proximal            154      22           +------------------+--------+--------+-------+ Mid                 169      22           +------------------+--------+--------+-------+ Distal              184      22           +------------------+--------+--------+-------+ +-----------------+--------+--------+-------+ Left Renal ArteryPSV cm/sEDV cm/sComment +-----------------+--------+--------+-------+ Origin             148      17           +-----------------+--------+--------+-------+ Proximal           130      15           +-----------------+--------+--------+-------+ Mid                121      14           +-----------------+--------+--------+-------+ Distal             119      14            +-----------------+--------+--------+-------+  Technologist observations: 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 +------------+--------+--------+----+-----------+--------+--------+----+ Right KidneyPSV cm/sEDV cm/sRI  Left KidneyPSV cm/sEDV cm/sRI   +------------+--------+--------+----+-----------+--------+--------+----+ Upper Pole  23      5       0.79Upper Pole 25      6       0.77 +------------+--------+--------+----+-----------+--------+--------+----+ Mid         28      5       0.  18      0       1.00 +------------+--------+--------+----+-----------+--------+--------+----+ Lower Pole  22      5       0.78Lower Pole 15      0       1.00 +------------+--------+--------+----+-----------+--------+--------+----+ Hilar       97      14      0.86Hilar      44      5       0.89 +------------+--------+--------+----+-----------+--------+--------+----+ +------------------+------+------------------+----+ Right Kidney            Left Kidney            +------------------+------+------------------+----+ RAR                     RAR                    +------------------+------+------------------+----+ RAR (manual)      .95   RAR (manual)      .58  +------------------+------+------------------+----+ Cortex            .71 cmCortex            .70  +------------------+------+------------------+----+ Cortex thickness        Corex thickness        +------------------+------+------------------+----+ Kidney length (cm)10.65 Kidney length (cm)9.74 +------------------+------+------------------+----+  Summary: Largest Aortic Diameter: Aortoiliac atherosclerosis with elevated velocities throughout. 2.2 cm  Renal:  Right: Normal size right kidney. Abnormal right Resistive Index.        Normal cortical thickness of right kidney. 1-59% stenosis of        the right renal artery, but may be greater due to  elevated        aortic velocities skewing RAR. RRV flow present. Left:  Normal size of left kidney. Abnormal left Resistive Index.        Normal cortical thickness of the left kidney. No evidence of        left renal artery stenosis, s/p stent placement. LRV flow        present. Cyst(s) noted. Avascular cystic mass noted in the        lower pole of the left kidney, measuring 3.0 cm, an increase        compared to prior exam. Mesenteric: Normal Celiac artery findings. 70 to 99% stenosis in the superior mesenteric artery.  Patent IVC.  *See table(s) above for measurements and observations.  Suggest follow up study in 12 months.  Diagnosing physician: Julien Nordmann MD  Electronically signed by Julien Nordmann MD on 02/07/2021 at 6:28:34 PM.    Final     EKG: 12/25/18 SR rate 63 normal ECG  02/14/2021 NSR rate 64 normal    ASSESSMENT AND PLAN:   1. HTN:  Well controlled.  Continue current medications and low sodium Dash type diet.  Norvasc dose increased by Dr Rene Paci  2. HLD:  On statin with history of CAD/RAS target LDL 70 less f/u labs with primary 3. Thyroid:  On replacement f/u labs with primary  4. Ortho:  Post right THR improved   4. CAD: post stent to mid circumflex 11/19/17 ok to use 81 mg ASA Exertional dyspnea ? Anginal equivalent not explained by echo today. Preoperative for left TKR will order lexiscan myovue   5. Mild AS:  Mean gradient 12 no change from September 2021 done on TTE today  6. RAS:  Post stenting of left renal 01/18/18 BP ok Cr 1.33 f/u duplex  done September 2022 no restenosis    Lexscan Myovue F/U 6 months   Signed: Charlton Haws 02/14/2021, 1:02 PM

## 2021-02-16 ENCOUNTER — Encounter: Payer: Self-pay | Admitting: Cardiovascular Disease

## 2021-02-16 ENCOUNTER — Other Ambulatory Visit: Payer: Self-pay

## 2021-02-16 ENCOUNTER — Ambulatory Visit (HOSPITAL_COMMUNITY): Payer: Medicare Other | Attending: Cardiovascular Disease

## 2021-02-16 ENCOUNTER — Ambulatory Visit (INDEPENDENT_AMBULATORY_CARE_PROVIDER_SITE_OTHER): Payer: Medicare Other | Admitting: Cardiovascular Disease

## 2021-02-16 VITALS — BP 110/64 | HR 63 | Ht 60.0 in | Wt 147.0 lb

## 2021-02-16 DIAGNOSIS — E782 Mixed hyperlipidemia: Secondary | ICD-10-CM | POA: Insufficient documentation

## 2021-02-16 DIAGNOSIS — I701 Atherosclerosis of renal artery: Secondary | ICD-10-CM | POA: Diagnosis present

## 2021-02-16 DIAGNOSIS — I350 Nonrheumatic aortic (valve) stenosis: Secondary | ICD-10-CM | POA: Diagnosis not present

## 2021-02-16 DIAGNOSIS — I2510 Atherosclerotic heart disease of native coronary artery without angina pectoris: Secondary | ICD-10-CM | POA: Insufficient documentation

## 2021-02-16 DIAGNOSIS — I10 Essential (primary) hypertension: Secondary | ICD-10-CM | POA: Diagnosis present

## 2021-02-16 DIAGNOSIS — Z01818 Encounter for other preprocedural examination: Secondary | ICD-10-CM

## 2021-02-16 DIAGNOSIS — Z01810 Encounter for preprocedural cardiovascular examination: Secondary | ICD-10-CM | POA: Diagnosis not present

## 2021-02-16 LAB — ECHOCARDIOGRAM COMPLETE
AR max vel: 1.83 cm2
AV Area VTI: 1.84 cm2
AV Area mean vel: 1.58 cm2
AV Mean grad: 11 mmHg
AV Peak grad: 19.2 mmHg
Ao pk vel: 2.19 m/s
Area-P 1/2: 3.65 cm2
P 1/2 time: 513 msec
S' Lateral: 1.7 cm

## 2021-02-16 NOTE — Patient Instructions (Addendum)
Medication Instructions:  *If you need a refill on your cardiac medications before your next appointment, please call your pharmacy*  Lab Work: If you have labs (blood work) drawn today and your tests are completely normal, you will receive your results only by: MyChart Message (if you have MyChart) OR A paper copy in the mail If you have any lab test that is abnormal or we need to change your treatment, we will call you to review the results.  Testing/Procedures: Your physician has requested that you have a lexiscan myoview. For further information please visit [email or web address]. Please follow instruction sheet, as given.  Follow-Up: At [facility], you and your health needs are our priority.  As part of our continuing mission to provide you with exceptional heart care, we have created designated Provider Care Teams.  These Care Teams include your primary Cardiologist (physician) and Advanced Practice Providers (APPs -  Physician Assistants and Nurse Practitioners) who all work together to provide you with the care you need, when you need it.  We recommend signing up for the patient portal called "MyChart".  Sign up information is provided on this After Visit Summary.  MyChart is used to connect with patients for Virtual Visits (Telemedicine).  Patients are able to view lab/test results, encounter notes, upcoming appointments, etc.  Non-urgent messages can be sent to your provider as well.   To learn more about what you can do with MyChart, go to [email or web address].    Your next appointment:   12 month(s)  The format for your next appointment:   In Person  Provider:   You may see Dr. Nishan or one of the following Advanced Practice Providers on your designated Care Team:   Laura Ingold, NP  

## 2021-02-21 ENCOUNTER — Telehealth (HOSPITAL_COMMUNITY): Payer: Self-pay

## 2021-02-21 NOTE — Telephone Encounter (Signed)
Detailed instructions left on the patient's answering machine. Asked to call back with any questions. S.Rinaldo Macqueen EMTP 

## 2021-02-22 ENCOUNTER — Other Ambulatory Visit: Payer: Self-pay

## 2021-02-22 ENCOUNTER — Ambulatory Visit (HOSPITAL_COMMUNITY): Payer: Medicare Other | Attending: Cardiology

## 2021-02-22 DIAGNOSIS — Z01818 Encounter for other preprocedural examination: Secondary | ICD-10-CM | POA: Insufficient documentation

## 2021-02-22 DIAGNOSIS — E782 Mixed hyperlipidemia: Secondary | ICD-10-CM | POA: Diagnosis present

## 2021-02-22 DIAGNOSIS — I10 Essential (primary) hypertension: Secondary | ICD-10-CM

## 2021-02-22 DIAGNOSIS — Z01810 Encounter for preprocedural cardiovascular examination: Secondary | ICD-10-CM | POA: Diagnosis not present

## 2021-02-22 DIAGNOSIS — I350 Nonrheumatic aortic (valve) stenosis: Secondary | ICD-10-CM

## 2021-02-22 DIAGNOSIS — I2510 Atherosclerotic heart disease of native coronary artery without angina pectoris: Secondary | ICD-10-CM | POA: Diagnosis present

## 2021-02-22 LAB — MYOCARDIAL PERFUSION IMAGING
LV dias vol: 42 mL (ref 46–106)
LV sys vol: 9 mL
Nuc Stress EF: 79 %
Peak HR: 95 {beats}/min
Rest HR: 67 {beats}/min
Rest Nuclear Isotope Dose: 10.5 mCi
SDS: 0
SRS: 0
SSS: 0
ST Depression (mm): 0 mm
Stress Nuclear Isotope Dose: 32.6 mCi
TID: 0.98

## 2021-02-22 MED ORDER — TECHNETIUM TC 99M TETROFOSMIN IV KIT
10.5000 | PACK | Freq: Once | INTRAVENOUS | Status: AC | PRN
  Administered 2021-02-22: 10.5 via INTRAVENOUS
  Filled 2021-02-22: qty 11

## 2021-02-22 MED ORDER — REGADENOSON 0.4 MG/5ML IV SOLN
0.4000 mg | Freq: Once | INTRAVENOUS | Status: AC
  Administered 2021-02-22: 0.4 mg via INTRAVENOUS

## 2021-02-22 MED ORDER — TECHNETIUM TC 99M TETROFOSMIN IV KIT
32.6000 | PACK | Freq: Once | INTRAVENOUS | Status: AC | PRN
  Administered 2021-02-22: 32.6 via INTRAVENOUS
  Filled 2021-02-22: qty 33

## 2021-03-03 ENCOUNTER — Encounter (HOSPITAL_COMMUNITY): Payer: Medicare Other

## 2021-11-02 ENCOUNTER — Other Ambulatory Visit: Payer: Self-pay | Admitting: Internal Medicine

## 2021-11-02 DIAGNOSIS — Z1231 Encounter for screening mammogram for malignant neoplasm of breast: Secondary | ICD-10-CM

## 2021-12-15 ENCOUNTER — Ambulatory Visit: Payer: Medicare Other

## 2021-12-19 ENCOUNTER — Ambulatory Visit
Admission: RE | Admit: 2021-12-19 | Discharge: 2021-12-19 | Disposition: A | Discharge status: Home / Self Care | Payer: Medicare Other | Source: Ambulatory Visit | Attending: Internal Medicine | Admitting: Internal Medicine

## 2021-12-19 DIAGNOSIS — Z1231 Encounter for screening mammogram for malignant neoplasm of breast: Secondary | ICD-10-CM

## 2022-02-07 ENCOUNTER — Ambulatory Visit (HOSPITAL_COMMUNITY)
Admission: RE | Admit: 2022-02-07 | Discharge: 2022-02-07 | Disposition: A | Discharge status: Home / Self Care | Payer: Medicare Other | Source: Ambulatory Visit | Attending: Cardiovascular Disease | Admitting: Cardiovascular Disease

## 2022-02-07 DIAGNOSIS — I701 Atherosclerosis of renal artery: Secondary | ICD-10-CM | POA: Insufficient documentation

## 2022-02-07 DIAGNOSIS — Z9889 Other specified postprocedural states: Secondary | ICD-10-CM | POA: Insufficient documentation

## 2022-04-24 NOTE — Progress Notes (Signed)
CARDIOLOGY CONSULT NOTE      Patient ID: DON GIARRUSSO MRN: [id number] DOB/AGE: Sep 06, 1940 81 y.o.  Referring Physician: Self Primary Physician: Georgann Housekeeper, MD Primary Cardiologist: Eden Emms   HPI:  81 y.o. with history of HTN, HLD, Hypothyroidism and arthritis with right THR done in August of 2020 by Dr Jerl Santos with no cardiac issues Notes indicate history of CAD with stent as well as renal artery stent Review of records from [facility] indicate angina with DES to mid circumflex 11/29/17. EF normal Echo done 10/17/18 with mild MR and mild AS mean gradient 10 peak 18 mmHg. RA stent placed on left September 10/2017 She has been intolerant to most statins including zocar, lipitor and crestor as well as zetia Able to take pravastatin   TTE 02/04/20 EF 60-65% mild AS mean gradient only 12 mmHg trivial MR  Renal Duplex 02/07/21 no residual RAS   LDL 109 Cr 1.33 on lab review  Children are in [place] Husband passed 11 years ago She is at retirement home [employer] and [employer] use to ski a lot   Norvasc increased for BP and statin increased for LDL 121 by primary   TTE 05/02/2022 mean gradient unchanged EF normal mild AR/MR :     She did not have classic angina before her circumflex stent just dyspnea  Right THR Has bad left knee but shots working so far   Preoperative myovue 02/22/21 normal no ischemia EF 79% TTE 02/16/21 EF 60-65% atrial septal aneurysm no shunt mild MR/AR and mild AS with mean gradient 11 peak 19 mmHg Renal duplex 02/07/22 left stent patient no residual obstructive dx  No cardiac symptoms Driving to [place] to see kids/grand kids  ROS All other systems reviewed and negative except as noted above  Past Medical History:  Diagnosis Date   Aortic regurgitation    Arthritis    Atrial septal aneurysm    Complication of anesthesia    Trouble voiding  after   Coronary artery disease    Dyspnea    with activity   Family history of adverse reaction  to anesthesia    sister is hard to wake up and violent vomiting   Heart murmur    History of stent insertion of renal artery    Hyperlipidemia    Hypertension    Hypothyroidism    Interatrial septal aneurysm with PFO    Leg length discrepancy    Mild aortic stenosis    Renal artery stenosis (HCC)     No family history on file.  Social History   Socioeconomic History   Marital status: Widowed    Spouse name: Not on file   Number of children: Not on file   Years of education: Not on file   Highest education level: Not on file  Occupational History   Not on file  Tobacco Use   Smoking status: Never   Smokeless tobacco: Never  Vaping Use   Vaping Use: Never used  Substance and Sexual Activity   Alcohol use: Yes    Alcohol/week: 1.0 standard drink of alcohol    Types: 1 Glasses of wine per week    Comment: 2 x a week   Drug use: Not Currently   Sexual activity: Not Currently  Other Topics Concern   Not on file  Social History Narrative   Not on file   Social Determinants of Health   Financial Resource Strain: Not on file  Food Insecurity: Not on file  Transportation Needs:  Not on file  Physical Activity: Not on file  Stress: Not on file  Social Connections: Not on file  Intimate Partner Violence: Not on file    Past Surgical History:  Procedure Laterality Date   ABDOMINAL HYSTERECTOMY     CARDIAC CATHETERIZATION  2019   CORONARY ANGIOPLASTY WITH STENT PLACEMENT     x1   JOINT REPLACEMENT     left hip   RENAL ARTERY STENT     TONSILLECTOMY     TOTAL HIP ARTHROPLASTY Right 12/31/2018   Procedure: Right Anterior Hip Arthroplasty;  Surgeon: Marcene Corning, MD;  Location: [facility];  Service: Orthopedics;  Laterality: Right;   TUBAL LIGATION          Physical Exam: Blood pressure 130/64, pulse (!) 58, height 5' (1.524 m), weight 148 lb (67.1 kg), SpO2 96 %.   Affect appropriate Healthy:  appears stated age HEENT: normal Neck supple with no adenopathy JVP  normal no bruits no thyromegaly Lungs clear with no wheezing and good diaphragmatic motion Heart:  S1/S2 mild AS murmur, no rub, gallop or click PMI normal Abdomen: benighn, BS positve, no tenderness, no AAA no bruit.  No HSM or HJR Distal pulses intact with no bruits No edema     Labs:   Lab Results  Component Value Date   WBC 3.9 03/11/2019   HGB 11.2 03/11/2019   HCT 33.7 (L) 03/11/2019   MCV 88 03/11/2019   PLT 240 03/11/2019   Radiology: No results found.  EKG: 12/25/18 SR rate 63 normal ECG  05/02/2022 NSR rate 58 normal    ASSESSMENT AND PLAN:   1. HTN:  Well controlled.  Continue current medications and low sodium Dash type diet.  Norvasc dose increased by Dr Rene Paci  2. HLD:  On statin with history of CAD/RAS target LDL 70 less f/u labs with primary 3. Thyroid:  On replacement f/u labs with primary  4. Ortho:  Post right THR improved  Left knee better with shots no need for TKR yet 4. CAD: post stent to mid circumflex 11/19/17 ok to use 81 mg ASA Myovue done 02/22/21 non ischemic Medical Rx  5. Mild AS:  no change on TTE done 02/16/21 see above  6. RAS:  Post stenting of left renal 01/18/18 BP ok Cr 1.33 duplex 02/07/22 no re-stenosis   F/U in a year with TTE 02/2023 for AS   Signed: Charlton Haws 05/02/2022, 9:19 AM

## 2022-05-02 ENCOUNTER — Ambulatory Visit: Payer: Medicare Other | Attending: Cardiovascular Disease | Admitting: Cardiovascular Disease

## 2022-05-02 ENCOUNTER — Encounter: Payer: Self-pay | Admitting: Cardiovascular Disease

## 2022-05-02 VITALS — BP 130/64 | HR 58 | Ht 60.0 in | Wt 148.0 lb

## 2022-05-02 DIAGNOSIS — I701 Atherosclerosis of renal artery: Secondary | ICD-10-CM | POA: Diagnosis not present

## 2022-05-02 DIAGNOSIS — E785 Hyperlipidemia, unspecified: Secondary | ICD-10-CM | POA: Diagnosis not present

## 2022-05-02 DIAGNOSIS — I10 Essential (primary) hypertension: Secondary | ICD-10-CM | POA: Diagnosis present

## 2022-05-02 DIAGNOSIS — I350 Nonrheumatic aortic (valve) stenosis: Secondary | ICD-10-CM | POA: Diagnosis not present

## 2022-05-02 NOTE — Patient Instructions (Addendum)
Medication Instructions:  Your physician recommends that you continue on your current medications as directed. Please refer to the Current Medication list given to you today.  *If you need a refill on your cardiac medications before your next appointment, please call your pharmacy*  Lab Work: If you have labs (blood work) drawn today and your tests are completely normal, you will receive your results only by: MyChart Message (if you have MyChart) OR A paper copy in the mail If you have any lab test that is abnormal or we need to change your treatment, we will call you to review the results.  Follow-Up: At [facility], you and your health needs are our priority.  As part of our continuing mission to provide you with exceptional heart care, we have created designated Provider Care Teams.  These Care Teams include your primary Cardiologist (physician) and Advanced Practice Providers (APPs -  Physician Assistants and Nurse Practitioners) who all work together to provide you with the care you need, when you need it.  We recommend signing up for the patient portal called "MyChart".  Sign up information is provided on this After Visit Summary.  MyChart is used to connect with patients for Virtual Visits (Telemedicine).  Patients are able to view lab/test results, encounter notes, upcoming appointments, etc.  Non-urgent messages can be sent to your provider as well.   To learn more about what you can do with MyChart, go to [email or web address].    Your next appointment:   12 month(s)  The format for your next appointment:   In Person  Provider:   Charlton Haws, MD      Important Information About Sugar

## 2022-05-02 NOTE — Addendum Note (Signed)
Addended by: Virl Axe, Calvert Charland L on: 05/02/2022 03:52 PM   Modules accepted: Orders

## 2022-05-02 NOTE — Telephone Encounter (Signed)
Patient called back to let me know that Dr. Donette Larry put her on metoprolol 25 mg BID in October. Made changes and updated patient's medication list.

## 2022-09-28 ENCOUNTER — Encounter: Payer: Self-pay | Admitting: Cardiovascular Disease

## 2022-09-29 NOTE — Telephone Encounter (Signed)
Per last ov note by Nishan: F/U in a year with TTE 02/2023 for AS   Signed: Charlton Haws 05/02/2022, 9:19 AM  Routing to MD to get okay to go ahead and do repeat Echo.

## 2022-10-02 ENCOUNTER — Ambulatory Visit: Payer: Medicare Other | Attending: Internal Medicine | Admitting: Internal Medicine

## 2022-10-02 ENCOUNTER — Encounter: Payer: Self-pay | Admitting: Internal Medicine

## 2022-10-02 VITALS — BP 138/80 | HR 67 | Ht 60.0 in | Wt 145.6 lb

## 2022-10-02 DIAGNOSIS — I700 Atherosclerosis of aorta: Secondary | ICD-10-CM | POA: Insufficient documentation

## 2022-10-02 DIAGNOSIS — I701 Atherosclerosis of renal artery: Secondary | ICD-10-CM | POA: Insufficient documentation

## 2022-10-02 DIAGNOSIS — I10 Essential (primary) hypertension: Secondary | ICD-10-CM

## 2022-10-02 DIAGNOSIS — N1831 Chronic kidney disease, stage 3a: Secondary | ICD-10-CM | POA: Diagnosis present

## 2022-10-02 DIAGNOSIS — I2510 Atherosclerotic heart disease of native coronary artery without angina pectoris: Secondary | ICD-10-CM | POA: Diagnosis present

## 2022-10-02 DIAGNOSIS — E785 Hyperlipidemia, unspecified: Secondary | ICD-10-CM | POA: Insufficient documentation

## 2022-10-02 DIAGNOSIS — R0609 Other forms of dyspnea: Secondary | ICD-10-CM

## 2022-10-02 MED ORDER — EMPAGLIFLOZIN 10 MG PO TABS: 10.0000 mg | ORAL_TABLET | Freq: Every day | ORAL | 0 refills | Status: DC

## 2022-10-02 MED ORDER — FUROSEMIDE 20 MG PO TABS: 20.0000 mg | ORAL_TABLET | Freq: Every day | ORAL | 3 refills | Status: DC

## 2022-10-02 MED ORDER — EMPAGLIFLOZIN 10 MG PO TABS: 10.0000 mg | ORAL_TABLET | Freq: Every day | ORAL | 3 refills | Status: DC

## 2022-10-02 NOTE — Progress Notes (Signed)
Cardiology Office Note:    Date:  10/02/2022   ID:  Caitlin Logan, DOB 1940-07-24, MRN [id number]  PCP:  Georgann Housekeeper, MD   [facility] Providers Cardiologist:  Charlton Haws. MD Cardiologist:  Alverda Skeans, MD (DOD) Referring MD: Georgann Housekeeper, MD   Chief Complaint/Reason for Referral: Dyspnea on exertion  ASSESSMENT:    1. Dyspnea on exertion   2. Coronary artery disease involving native coronary artery of native heart without angina pectoris   3. RAS (renal artery stenosis) (HCC)   4. Primary hypertension   5. Hyperlipidemia LDL goal <70   6. Stage 3a chronic kidney disease (HCC)   7. Aortic atherosclerosis (HCC)     PLAN:    In order of problems listed above: 1.  Dyspnea on exertion: Caitlin Logan last echocardiogram in 2022 demonstrated evidence of elevated filling pressures with a preserved LV function.  I suspect Caitlin Logan has developed diastolic heart failure.  We will start diuretics and Caitlin Logan will take Lasix 20 mg twice daily for 3 days then chronic 20 mg a day.  We will start Jardiance 10 mg a day.  She will continue lisinopril and Lopressor for now.  Will check a BMP in 1 week.  I will obtain an echocardiogram to evaluate LV function in Caitlin state of Caitlin Logan aortic valve (SEM on exam).  She is in normal sinus rhythm today; if she does not respond to conservative medical therapy then perhaps a monitor to assess for occult atrial fibrillation would be in order.  Will have Caitlin Logan see [place] back in 1 month. 2.  Coronary artery disease: While Caitlin Logan is anginal equivalent around Caitlin time of Caitlin Logan stenting with shortness of breath I suspect Caitlin Logan current symptoms represent diastolic heart failure rather than a coronary artery issue.  Certainly after she diuresis she continues to be short of breath then an ischemic evaluation would be in order. 3.  Renal artery stenosis: Had a reassuring renal ultrasound last year.  Monitor for now. 4.  Hypertension: Blood pressure is  well-controlled on Caitlin Logan current regimen. 5.  Hyperlipidemia: Check lipid panel, LFTs, LP(a) next week.  Goal LDL is less than 70 if LP(a) is elevated and Caitlin goal LDL is less than 55.  Given intolerances to multiple statins would consider pharmacy referral. 6.  Stage III chronic kidney disease: Check BMP next week. 7.  Aortic atherosclerosis: Continue aspirin, statin, and strict blood pressure control.             Dispo:  Return in about 4 weeks (around 10/30/2022).      Medication Adjustments/Labs and Tests Ordered: Current medicines are reviewed at length with Caitlin Logan today.  Concerns regarding medicines are outlined above.  Caitlin following changes have been made:     Labs/tests ordered: Orders Placed This Encounter  Procedures   Lipid panel   Lipoprotein A (LPA)   Comprehensive metabolic panel   ECHOCARDIOGRAM COMPLETE    Medication Changes: Meds ordered this encounter  Medications   empagliflozin (JARDIANCE) 10 MG TABS tablet    Sig: Take 1 tablet (10 mg total) by mouth daily before breakfast.    Dispense:  90 tablet    Refill:  3   empagliflozin (JARDIANCE) 10 MG TABS tablet    Sig: Take 1 tablet (10 mg total) by mouth daily before breakfast.    Dispense:  28 tablet    Refill:  0   furosemide (LASIX) 20 MG tablet    Sig: Take 1  tablet (20 mg total) by mouth daily. Take 2 tablets daily for 3 days then take one tablet daily    Dispense:  90 tablet    Refill:  3     Current medicines are reviewed at length with Caitlin Logan today.  Caitlin Logan does not have concerns regarding medicines.   History of Present Illness:    FOCUSED PROBLEM LIST:   1.  Coronary artery disease status post PCI of mid left circumflex in 2019; anginal equivalent at that time was shortness of breath 2.  Hyperlipidemia with intolerance to multiple statins including Lipitor, Crestor, and Zocor 3.  Hypertension  4.  Hypothyroidism 5.  CKD stage III 6.  Renal artery stenosis status post  stenting of left renal artery in 2019 7.  Aortic atherosclerosis on chest x-ray 2020   Caitlin Logan is a 82 y.o. female with Caitlin indicated medical history here for for shortness of breath.  I am seeing Caitlin Logan for an expedited appointment due to shortness of breath.  Caitlin Logan is normally managed by Dr. Eden Emms who last saw Caitlin Logan in December 2023.  At that point in time she was doing fairly well and had had a renal ultrasound done which was reassuring.  She had previously undergone a nuclear stress test in 2022 without ischemia or infarction as well as an echocardiogram which demonstrated preserved LV function with mild aortic stenosis and an intra-atrial septal aneurysm.  Caitlin Logan tells me that she started getting occasionally short of breath may be last year.  Over Caitlin intervening months she has noticed increasing shortness of breath and peripheral edema.  This is greatly affected Caitlin Logan ability to do things.  She is quite active and likes to play golf.  She fortunately has not required any emergency room visits or hospitalizations.  She denies any palpitations, presyncope, or syncope.  She really has not orthopneic and denies any episodes of paroxysmal nocturnal dyspnea.  She is completely compliant with Caitlin Logan medications.    She does not smoke.  She is a retired Armed forces operational officer.          Current Medications: Current Meds  Medication Sig   amLODipine (NORVASC) 5 MG tablet Take 5 mg by mouth 2 (two) times daily.   aspirin EC 81 MG tablet Take 1 tablet (81 mg total) by mouth 2 (two) times daily after a meal. (Logan taking differently: Take 81 mg by mouth daily.)   cholecalciferol (VITAMIN D3) 25 MCG (1000 UNIT) tablet Take 2,000 Units by mouth daily.   empagliflozin (JARDIANCE) 10 MG TABS tablet Take 1 tablet (10 mg total) by mouth daily before breakfast.   empagliflozin (JARDIANCE) 10 MG TABS tablet Take 1 tablet (10 mg total) by mouth daily before breakfast.   furosemide (LASIX)  20 MG tablet Take 1 tablet (20 mg total) by mouth daily. Take 2 tablets daily for 3 days then take one tablet daily   lisinopril (ZESTRIL) 40 MG tablet Take 1 tablet (40 mg total) by mouth daily.   metoprolol tartrate (LOPRESSOR) 25 MG tablet Take 25 mg by mouth 2 (two) times daily.   pravastatin (PRAVACHOL) 40 MG tablet Take 1 tablet (40 mg total) by mouth at bedtime.   SYNTHROID 88 MCG tablet Take 88 mcg by mouth daily.     Allergies:    Hydrocodone and Propoxyphene   Social History:   Social History   Tobacco Use   Smoking status: Never   Smokeless tobacco: Never  Vaping Use  Vaping Use: Never used  Substance Use Topics   Alcohol use: Yes    Alcohol/week: 1.0 standard drink of alcohol    Types: 1 Glasses of wine per week    Comment: 2 x a week   Drug use: Not Currently     Family Hx: History reviewed. No pertinent family history.   Review of Systems:   Please see Caitlin history of present illness.    All other systems reviewed and are negative.     EKGs/Labs/Other Test Reviewed:    EKG:  EKG performed December 2023 that I personally reviewed demonstrates sinus bradycardia  Prior CV studies:  Cardiac Studies & Procedures     STRESS TESTS  MYOCARDIAL PERFUSION IMAGING 02/22/2021  Narrative   Caitlin study is normal. Caitlin study is low risk.   No ST deviation was noted.   Left ventricular function is normal. Nuclear stress EF: 79 %. Caitlin left ventricular ejection fraction is hyperdynamic (>65%). End diastolic cavity size is normal. End systolic cavity size is normal.   Prior study not available for comparison.  Normal stress nuclear study with no ischemia or infarction.  Gated ejection fraction 79% with normal wall motion.   ECHOCARDIOGRAM  ECHOCARDIOGRAM COMPLETE 02/16/2021  Narrative ECHOCARDIOGRAM REPORT    Logan Name:   SHLOKA RIFF Date of Exam: 02/16/2021 Medical Rec #:  [id number]        Height:       60.0 in Accession #:    [id number]       Weight:        142.0 lb Date of Birth:  03-Dec-1940         BSA:          1.614 m Logan Age:    82 years         BP:           170/78 mmHg Logan Gender: F                HR:           66 bpm. Exam Location:  [address]  Procedure: 2D Echo, Cardiac Doppler and Color Doppler  Indications:    I35.0 Nonrheumatic aortic (valve) stenosis  History:        Logan has prior history of Echocardiogram examinations, most recent 08/04/2019. CAD, Signs/Symptoms:Dyspnea and Murmur; Risk Factors:Hypertension and Dyslipidemia. Renal artery stenosis. Hypothyroidism. Atrial septal aneurysm. PFO.  Sonographer:    Cathie Beams RCS Referring Phys: [id number]  IMPRESSIONS   1. Left ventricular ejection fraction, by estimation, is 60 to 65%. Caitlin left ventricle has normal function. Caitlin left ventricle has no regional wall motion abnormalities. Left ventricular diastolic parameters are indeterminate. Elevated left ventricular end-diastolic pressure. 2. Right ventricular systolic function is normal. Caitlin right ventricular size is normal. 3. There is left to right bowing of Caitlin IAS c/w elevated LA pressure. (atrial septal aneurism). 4. Caitlin mitral valve is grossly normal. Mild mitral valve regurgitation. 5. Caitlin aortic valve is calcified. Aortic valve regurgitation is mild. Mild aortic valve stenosis.  FINDINGS Left Ventricle: Left ventricular ejection fraction, by estimation, is 60 to 65%. Caitlin left ventricle has normal function. Caitlin left ventricle has no regional wall motion abnormalities. Caitlin left ventricular internal cavity size was normal in size. There is no left ventricular hypertrophy. Left ventricular diastolic parameters are indeterminate. Elevated left ventricular end-diastolic pressure.  Right Ventricle: Caitlin right ventricular size is normal. No increase in right ventricular wall thickness. Right ventricular  systolic function is normal.  Left Atrium: Left atrial size was normal in size.  Right  Atrium: Right atrial size was normal in size.  Pericardium: There is no evidence of pericardial effusion.  Mitral Valve: Caitlin mitral valve is grossly normal. Mild mitral valve regurgitation.  Tricuspid Valve: Caitlin tricuspid valve is grossly normal. Tricuspid valve regurgitation is mild.  Aortic Valve: Caitlin aortic valve is calcified. Aortic valve regurgitation is mild. Aortic regurgitation PHT measures 513 msec. Mild aortic stenosis is present. Aortic valve mean gradient measures 11.0 mmHg. Aortic valve peak gradient measures 19.2 mmHg. Aortic valve area, by VTI measures 1.84 cm.  Pulmonic Valve: Caitlin pulmonic valve was grossly normal. Pulmonic valve regurgitation is trivial.  Aorta: Caitlin aortic root and ascending aorta are structurally normal, with no evidence of dilitation.  IAS/Shunts: Caitlin interatrial septum is aneurysmal. Caitlin atrial septum is grossly normal.   LEFT VENTRICLE PLAX 2D LVIDd:         4.30 cm  Diastology LVIDs:         1.70 cm  LV e' medial:    8.59 cm/s LV PW:         1.00 cm  LV E/e' medial:  14.9 LV IVS:        0.90 cm  LV e' lateral:   8.92 cm/s LVOT diam:     1.90 cm  LV E/e' lateral: 14.3 LV SV:         99 LV SV Index:   61 LVOT Area:     2.84 cm   RIGHT VENTRICLE RV Basal diam:  2.30 cm RV S prime:     12.60 cm/s TAPSE (M-mode): 2.3 cm RVSP:           28.0 mmHg  LEFT ATRIUM             Index       RIGHT ATRIUM           Index LA diam:        3.30 cm 2.04 cm/m  RA Pressure: 3.00 mmHg LA Vol (A2C):   46.9 ml 29.06 ml/m RA Area:     12.20 cm LA Vol (A4C):   51.6 ml 31.98 ml/m RA Volume:   28.00 ml  17.35 ml/m LA Biplane Vol: 51.2 ml 31.73 ml/m AORTIC VALVE AV Area (Vmax):    1.83 cm AV Area (Vmean):   1.58 cm AV Area (VTI):     1.84 cm AV Vmax:           219.00 cm/s AV Vmean:          155.000 cm/s AV VTI:            0.538 m AV Peak Grad:      19.2 mmHg AV Mean Grad:      11.0 mmHg LVOT Vmax:         141.00 cm/s LVOT Vmean:        86.600  cm/s LVOT VTI:          0.349 m LVOT/AV VTI ratio: 0.65 AI PHT:            513 msec  AORTA Ao Root diam: 2.70 cm  MITRAL VALVE                TRICUSPID VALVE MV Area (PHT): 3.65 cm     TR Peak grad:   25.0 mmHg MV Decel Time: 208 msec     TR Vmax:  250.00 cm/s MV E velocity: 128.00 cm/s  Estimated RAP:  3.00 mmHg MV A velocity: 125.00 cm/s  RVSP:           28.0 mmHg MV E/A ratio:  1.02 SHUNTS Systemic VTI:  0.35 m Systemic Diam: 1.90 cm  Kristeen Miss MD Electronically signed by Kristeen Miss MD Signature Date/Time: 02/16/2021/4:29:56 PM    Final             Other studies Reviewed: Review of Caitlin additional studies/records demonstrates: Chest x-ray 2020 demonstrates aortic calcific atherosclerosis  Recent Labs: No results found for requested labs within last 365 days.   Recent Lipid Panel Lab Results  Component Value Date/Time   CHOL 172 03/11/2019 10:25 AM   TRIG 68 03/11/2019 10:25 AM   HDL 76 03/11/2019 10:25 AM   LDLCALC 83 03/11/2019 10:25 AM    Risk Assessment/Calculations:                Physical Exam:    VS:  BP 138/80   Pulse 67   Ht 5' (1.524 m)   Wt 145 lb 9.6 oz (66 kg)   SpO2 97%   BMI 28.44 kg/m    Wt Readings from Last 3 Encounters:  10/02/22 145 lb 9.6 oz (66 kg)  05/02/22 148 lb (67.1 kg)  02/22/21 147 lb (66.7 kg)    GENERAL:  No apparent distress, AOx3 HEENT:  No carotid bruits, +2 carotid impulses, no scleral icterus CAR: RRR with 2 out of 6 systolic murmur and no gallops, rubs, or thrills RES:  Clear to auscultation bilaterally ABD:  Soft, nontender, nondistended, positive bowel sounds x 4 VASC:  +2 radial pulses, +2 carotid pulses, palpable pedal pulses NEURO:  CN 2-12 grossly intact; motor and sensory grossly intact PSYCH:  No active depression or anxiety EXT:  No edema, ecchymosis, or cyanosis  Signed, Orbie Pyo, MD  10/02/2022 8:03 PM    [facility] [address] [place], [place], [place]   [address] Phone: [phone]; Fax: [phone]   Note:  This document was prepared using Dragon voice recognition software and may include unintentional dictation errors.

## 2022-10-02 NOTE — Patient Instructions (Signed)
Medication Instructions:  Your physician has recommended you make the following change in your medication:   Start Jardiance 10 mg daily Lasix 20 mg, take 2 tablets daily for 3 days the take one tablet daily  *If you need a refill on your cardiac medications before your next appointment, please call your pharmacy*   Lab Work: CMET, Lipids and Lpa in 1 week  If you have labs (blood work) drawn today and your tests are completely normal, you will receive your results only by: MyChart Message (if you have MyChart) OR A paper copy in the mail If you have any lab test that is abnormal or we need to change your treatment, we will call you to review the results.   Testing/Procedures: Your physician has requested that you have an echocardiogram. Echocardiography is a painless test that uses sound waves to create images of your heart. It provides your doctor with information about the size and shape of your heart and how well your heart's chambers and valves are working. This procedure takes approximately one hour. There are no restrictions for this procedure. Please do NOT wear cologne, perfume, aftershave, or lotions (deodorant is allowed). Please arrive 15 minutes prior to your appointment time.    Follow-Up: At [facility], you and your health needs are our priority.  As part of our continuing mission to provide you with exceptional heart care, we have created designated Provider Care Teams.  These Care Teams include your primary Cardiologist (physician) and Advanced Practice Providers (APPs -  Physician Assistants and Nurse Practitioners) who all work together to provide you with the care you need, when you need it.  Your next appointment:   1 month(s)  Provider:   APP

## 2022-10-12 ENCOUNTER — Ambulatory Visit: Payer: Medicare Other | Attending: Internal Medicine

## 2022-10-12 DIAGNOSIS — E785 Hyperlipidemia, unspecified: Secondary | ICD-10-CM

## 2022-10-12 DIAGNOSIS — N1831 Chronic kidney disease, stage 3a: Secondary | ICD-10-CM

## 2022-10-12 DIAGNOSIS — R0609 Other forms of dyspnea: Secondary | ICD-10-CM

## 2022-10-12 DIAGNOSIS — I2510 Atherosclerotic heart disease of native coronary artery without angina pectoris: Secondary | ICD-10-CM

## 2022-10-13 LAB — COMPREHENSIVE METABOLIC PANEL WITH GFR
ALT: 14 IU/L (ref 0–32)
AST: 18 IU/L (ref 0–40)
Albumin/Globulin Ratio: 1.9 (ref 1.2–2.2)
Albumin: 4.1 g/dL (ref 3.7–4.7)
Alkaline Phosphatase: 68 IU/L (ref 44–121)
BUN/Creatinine Ratio: 21 (ref 12–28)
BUN: 28 mg/dL — ABNORMAL HIGH (ref 8–27)
Bilirubin Total: 0.3 mg/dL (ref 0.0–1.2)
CO2: 25 mmol/L (ref 20–29)
Calcium: 9.4 mg/dL (ref 8.7–10.3)
Chloride: 101 mmol/L (ref 96–106)
Creatinine, Ser: 1.32 mg/dL — ABNORMAL HIGH (ref 0.57–1.00)
Globulin, Total: 2.2 g/dL (ref 1.5–4.5)
Glucose: 94 mg/dL (ref 70–99)
Potassium: 4.4 mmol/L (ref 3.5–5.2)
Sodium: 139 mmol/L (ref 134–144)
Total Protein: 6.3 g/dL (ref 6.0–8.5)
eGFR: 41 mL/min/1.73 — ABNORMAL LOW

## 2022-10-13 LAB — LIPID PANEL
Chol/HDL Ratio: 2.2 ratio (ref 0.0–4.4)
Cholesterol, Total: 175 mg/dL (ref 100–199)
HDL: 81 mg/dL
LDL Chol Calc (NIH): 77 mg/dL (ref 0–99)
Triglycerides: 96 mg/dL (ref 0–149)
VLDL Cholesterol Cal: 17 mg/dL (ref 5–40)

## 2022-10-13 LAB — LIPOPROTEIN A (LPA): Lipoprotein (a): 71.1 nmol/L (ref <75.0)

## 2022-10-15 ENCOUNTER — Encounter: Payer: Self-pay | Admitting: Internal Medicine

## 2022-10-16 ENCOUNTER — Telehealth: Payer: Self-pay

## 2022-10-16 NOTE — Telephone Encounter (Signed)
-----   Message from Orbie Pyo, MD sent at 10/14/2022 11:07 AM EDT ----- Please refer to pharmacy for lipid management.   Please call patient and ask her what her intolerances are to Lipitor, Crestor, and Zocor and add these into her EPIC allergies list.  Thanks

## 2022-10-16 NOTE — Telephone Encounter (Signed)
Left voicemail to return call to office.

## 2022-10-16 NOTE — Telephone Encounter (Signed)
Pt called back. She states she has reviewed through mychart and would like to discuss a little further at her next appt with APP. She states she does not need a c/b.

## 2022-10-20 ENCOUNTER — Ambulatory Visit (HOSPITAL_COMMUNITY): Payer: Medicare Other | Attending: Internal Medicine

## 2022-10-20 DIAGNOSIS — I2510 Atherosclerotic heart disease of native coronary artery without angina pectoris: Secondary | ICD-10-CM

## 2022-10-20 DIAGNOSIS — R0609 Other forms of dyspnea: Secondary | ICD-10-CM

## 2022-10-20 DIAGNOSIS — N1831 Chronic kidney disease, stage 3a: Secondary | ICD-10-CM | POA: Diagnosis present

## 2022-10-20 DIAGNOSIS — E785 Hyperlipidemia, unspecified: Secondary | ICD-10-CM | POA: Diagnosis present

## 2022-10-20 LAB — ECHOCARDIOGRAM COMPLETE
AR max vel: 2 cm2
AV Area VTI: 1.89 cm2
AV Area mean vel: 1.86 cm2
AV Mean grad: 11.2 mmHg
AV Peak grad: 18.6 mmHg
Ao pk vel: 2.16 m/s
Area-P 1/2: 3.6 cm2
P 1/2 time: 624 msec
S' Lateral: 2.8 cm

## 2022-10-24 ENCOUNTER — Encounter: Payer: Self-pay | Admitting: Internal Medicine

## 2022-10-25 NOTE — Telephone Encounter (Signed)
Called patient to discuss.  Left VM that I would reply in MyChart.

## 2022-11-05 NOTE — Progress Notes (Unsigned)
Cardiology Office Note:  .   Date:  11/07/2022  ID:  Caitlin Logan, DOB 1941/04/27, MRN [id number] PCP: Georgann Housekeeper, MD  [facility] Providers Cardiologist:  Charlton Haws, MD    History of Present Illness: .   Caitlin Logan is a 82 y.o. female with PMH of CAD s/p PCI of mLCx 2019 with anginal equivalent at that time shortness of breath, renal artery stenosis s/p stenting of left renal artery 2019, HTN, HLD, aortic atherosclerosis, chronic HFpEF      Seen in cardiology clinic on 10/02/2022 by Dr. Lynnette Caffey (DOD work-in visit) for DOE. She reported increasing shortness of breath over the previous year as well as peripheral edema. Quite active, enjoys golf. Suspicion for diastolic heart failure. Was started on Lasix 20 mg twice daily for 3 days then 20 mg daily, Jardiance 10 mg daily, continue lisinopril and Lopressor.     ROS: Continues to have DOE. Lost 5 lbs on Lasix, ankle swelling decreased. Can walk on a flat surface with no problems, notes SOB when walking up an incline or for a long distance.  Has to stop after walking for a while. Breathing improves after resting for a few minutes, then can complete walk. Continues to walk several to per week for exercise.  Also attends exercise classes and swimming. Has 1-2 cups of coffee each morning and then plain water the remainder of the day. Admits she only drinks  24-32 ounces daily. Home BP 120s over 70s. She denies chest pain, fatigue, palpitations, melena, hematuria, diaphoresis, weakness, presyncope, syncope, orthopnea, and PND.  Studies Reviewed: .         Risk Assessment/Calculations:             Physical Exam:   VS:  BP 130/80   Pulse (!) 53   Ht 5' (1.524 m)   Wt 147 lb 3.2 oz (66.8 kg)   SpO2 98%   BMI 28.75 kg/m    Wt Readings from Last 3 Encounters:  11/07/22 147 lb 3.2 oz (66.8 kg)  10/02/22 145 lb 9.6 oz (66 kg)  05/02/22 148 lb (67.1 kg)    GEN: Well nourished, well developed in no acute distress NECK:  No JVD; No carotid bruits CARDIAC: RRR, 3/6 systolic murmur. No rubs, gallops RESPIRATORY:  Clear to auscultation without rales, wheezing or rhonchi  ABDOMEN: Soft, non-tender, non-distended EXTREMITIES:  No edema; No deformity   ASSESSMENT AND PLAN: .    DOE: Continues to have DOE, worse since May. Occasional mild LE edema. No orthopnea, PND. Weight loss of 5 lb on Lasix, weight is stable today. Follows low sodium diet. Admits to only drinking 24-32 ounces of water daily (including coffee). Encouraged improved hydration. Strong family history of CAD (mother - bypass surgery, father died from MI). Will get cardiac PET CT to evaluate for worsening ischemia.   Chronic HFpEF: She continues to have DOE, particularly with walking up an incline or far distance. No orthopnea, PND. Appears euvolemic on exam. Had good response to Lasix with 5 lb weight loss and improvement in LE edema. Continue low sodium diet and regular exercise.   CAD with ? angina: Previous stent to LCx 2019. Angina equivalent was shortness of breath. She continues to have shortness of breath despite 5 lb weight loss on Lasix. No chest pain. Has to stop to rest when walking, particularly when going up an incline.  Lexiscan Myoview with no evidence of ischemia or infarction 02/2021. We will get cardiac PET scan  for evaluation of worsening ischemia  Hypertension: BP is well controlled.   Aortic stenosis: Systolic murmur on exam. Moderate calcification of the aortic valve with trivial AI and mild aortic stenosis.  AV mean gradient 11.2 mmHg, AVA 1.89 cm. We discussed mild stenosis not likely causing DOE. We will continue to monitor valve function clinically for now.   Hyperlipidemia LDL goal < 55: Normal Lp(a), LDL 77 on 10/12/22. Intolerant to simvastatin, rosuvastatin, atorvastatin.  She is tolerating pravastatin without side effects.  We will add ezetimibe 10 mg daily. It is possible she tried ezetimibe in the past and is intolerance but  willing to try again. We discussed potential future use of PCSK9 inhibitor and bempedoic acid as alternatives to treatment. If she tolerates ezetimibe, we will recheck lipid panel in 2-3 months.   Renal artery stenosis: History of left renal artery stent. Reassuring renal ultrasound 02/07/22. BP is well controlled.    Informed Consent   Shared Decision Making/Informed Consent The risks [chest pain, shortness of breath, cardiac arrhythmias, dizziness, blood pressure fluctuations, myocardial infarction, stroke/transient ischemic attack, nausea, vomiting, allergic reaction, radiation exposure, metallic taste sensation and life-threatening complications (estimated to be 1 in 10,000)], benefits (risk stratification, diagnosing coronary artery disease, treatment guidance) and alternatives of a cardiac PET stress test were discussed in detail with Ms. Yeaman and she agrees to proceed.     Dispo: 2 months with me  Signed, Levi Aland, NP-C 11/07/2022, 12:43 PM [address] Office [phone] Fax [phone]

## 2022-11-07 ENCOUNTER — Ambulatory Visit: Payer: Medicare Other | Attending: Nurse Practitioner | Admitting: Nurse Practitioner

## 2022-11-07 ENCOUNTER — Encounter: Payer: Self-pay | Admitting: Nurse Practitioner

## 2022-11-07 VITALS — BP 130/80 | HR 53 | Ht 60.0 in | Wt 147.2 lb

## 2022-11-07 DIAGNOSIS — R0609 Other forms of dyspnea: Secondary | ICD-10-CM | POA: Diagnosis present

## 2022-11-07 DIAGNOSIS — I5032 Chronic diastolic (congestive) heart failure: Secondary | ICD-10-CM | POA: Diagnosis present

## 2022-11-07 DIAGNOSIS — I701 Atherosclerosis of renal artery: Secondary | ICD-10-CM | POA: Diagnosis present

## 2022-11-07 DIAGNOSIS — I25118 Atherosclerotic heart disease of native coronary artery with other forms of angina pectoris: Secondary | ICD-10-CM | POA: Diagnosis present

## 2022-11-07 DIAGNOSIS — I350 Nonrheumatic aortic (valve) stenosis: Secondary | ICD-10-CM | POA: Insufficient documentation

## 2022-11-07 DIAGNOSIS — I10 Essential (primary) hypertension: Secondary | ICD-10-CM | POA: Diagnosis present

## 2022-11-07 DIAGNOSIS — E785 Hyperlipidemia, unspecified: Secondary | ICD-10-CM | POA: Insufficient documentation

## 2022-11-07 MED ORDER — EZETIMIBE 10 MG PO TABS: 10.0000 mg | ORAL_TABLET | Freq: Every day | ORAL | 11 refills | Status: DC

## 2022-11-07 NOTE — Patient Instructions (Signed)
Medication Instructions:   START Zetia one (1) tablet by mouth ( 10 mg daily.   *If you need a refill on your cardiac medications before your next appointment, please call your pharmacy*   Lab Work:  TODAY!!!! BMET  If you have labs (blood work) drawn today and your tests are completely normal, you will receive your results only by: MyChart Message (if you have MyChart) OR A paper copy in the mail If you have any lab test that is abnormal or we need to change your treatment, we will call you to review the results.   Testing/Procedures:    Your cardiac CT will be scheduled at one of the below locations:   [facility] [address] [place], [place] [address] [phone]  If scheduled at [facility], please arrive at the [facility] and Children's Entrance (Entrance C2) of [facility] 30 minutes prior to test start time. You can use the FREE valet parking offered at entrance C (encouraged to control the heart rate for the test)  Proceed to the [facility] Radiology Department (first floor) to check-in and test prep.  All radiology patients and guests should use entrance C2 at [facility], accessed from [facility], even though the hospital's physical address listed is [address]     Please follow these instructions carefully (unless otherwise directed):  An IV will be required for this test and Nitroglycerin will be given.  Hold all erectile dysfunction medications at least 3 days (72 hrs) prior to test. (Ie viagra, cialis, sildenafil, tadalafil, etc)   On the Night Before the Test: Be sure to Drink plenty of water. Do not consume any caffeinated/decaffeinated beverages or chocolate 12 hours prior to your test. Do not take any antihistamines 12 hours prior to your test.  On the Day of the Test: Drink plenty of water until 1 hour prior to the test. Do not eat any food 1 hour prior to test. You may take your  regular medications prior to the test.  Take metoprolol (Lopressor) two (2) tablets by mouth ( 50 mg) two hours prior to test. If you take Furosemide please HOLD on the morning of the test. FEMALES- please wear underwire-free bra if available, avoid dresses & tight clothing    After the Test: Drink plenty of water. After receiving IV contrast, you may experience a mild flushed feeling. This is normal. On occasion, you may experience a mild rash up to 24 hours after the test. This is not dangerous. If this occurs, you can take Benadryl 25 mg and increase your fluid intake. If you experience trouble breathing, this can be serious. If it is severe call 911 IMMEDIATELY. If it is mild, please call our office. If you take any of these medications: Glipizide/Metformin, Avandament, Glucavance, please do not take 48 hours after completing test unless otherwise instructed.  We will call to schedule your test 2-4 weeks out understanding that some insurance companies will need an authorization prior to the service being performed.   For more information and frequently asked questions, please visit our website : [email or web address]  For non-scheduling related questions, please contact the cardiac imaging nurse navigator should you have any questions/concerns: Rockwell Alexandria, Cardiac Imaging Nurse Navigator Larey Brick, Cardiac Imaging Nurse Navigator [facility] Direct Office Dial: [phone]   For scheduling needs, including cancellations and rescheduling, please call [place], [phone].    Follow-Up: At [facility], you and your  health needs are our priority.  As part of our continuing mission to provide you with exceptional heart care, we have created designated Provider Care Teams.  These Care Teams include your primary Cardiologist (physician) and Advanced Practice Providers (APPs -  Physician Assistants and Nurse Practitioners) who all  work together to provide you with the care you need, when you need it.  We recommend signing up for the patient portal called "MyChart".  Sign up information is provided on this After Visit Summary.  MyChart is used to connect with patients for Virtual Visits (Telemedicine).  Patients are able to view lab/test results, encounter notes, upcoming appointments, etc.  Non-urgent messages can be sent to your provider as well.   To learn more about what you can do with MyChart, go to [email or web address].    Your next appointment:   2 month(s)  Provider:   Eligha Bridegroom, NP

## 2022-11-08 LAB — BASIC METABOLIC PANEL
BUN/Creatinine Ratio: 21 (ref 12–28)
BUN: 28 mg/dL — ABNORMAL HIGH (ref 8–27)
CO2: 24 mmol/L (ref 20–29)
Calcium: 9.9 mg/dL (ref 8.7–10.3)
Chloride: 101 mmol/L (ref 96–106)
Creatinine, Ser: 1.36 mg/dL — ABNORMAL HIGH (ref 0.57–1.00)
Glucose: 93 mg/dL (ref 70–99)
Potassium: 4.6 mmol/L (ref 3.5–5.2)
Sodium: 139 mmol/L (ref 134–144)
eGFR: 39 mL/min/{1.73_m2} — ABNORMAL LOW (ref >59)

## 2022-12-01 ENCOUNTER — Telehealth (HOSPITAL_COMMUNITY): Payer: Self-pay | Admitting: *Deleted

## 2022-12-01 NOTE — Telephone Encounter (Signed)
Attempted to call patient regarding upcoming cardiac PET appointment. Left message on voicemail with name and callback number  Larey Brick RN Navigator Cardiac Imaging [facility] [phone] Office [phone] Cell  Reminder to hold caffeine 12 hours prior to her cardiac PET scan.

## 2022-12-05 ENCOUNTER — Ambulatory Visit (HOSPITAL_COMMUNITY)
Admission: RE | Admit: 2022-12-05 | Discharge: 2022-12-05 | Disposition: A | Discharge status: Home / Self Care | Payer: Medicare Other | Source: Ambulatory Visit | Attending: Nurse Practitioner | Admitting: Nurse Practitioner

## 2022-12-05 DIAGNOSIS — E785 Hyperlipidemia, unspecified: Secondary | ICD-10-CM | POA: Insufficient documentation

## 2022-12-05 DIAGNOSIS — I10 Essential (primary) hypertension: Secondary | ICD-10-CM | POA: Insufficient documentation

## 2022-12-05 DIAGNOSIS — I701 Atherosclerosis of renal artery: Secondary | ICD-10-CM | POA: Diagnosis not present

## 2022-12-05 DIAGNOSIS — R0609 Other forms of dyspnea: Secondary | ICD-10-CM | POA: Diagnosis not present

## 2022-12-05 DIAGNOSIS — I25118 Atherosclerotic heart disease of native coronary artery with other forms of angina pectoris: Secondary | ICD-10-CM | POA: Insufficient documentation

## 2022-12-05 MED ORDER — REGADENOSON 0.4 MG/5ML IV SOLN
0.4000 mg | Freq: Once | INTRAVENOUS | Status: AC
  Administered 2022-12-05: 0.4 mg via INTRAVENOUS

## 2022-12-05 MED ORDER — REGADENOSON 0.4 MG/5ML IV SOLN
INTRAVENOUS | Status: AC
  Filled 2022-12-05: qty 5

## 2022-12-05 MED ORDER — RUBIDIUM RB82 GENERATOR (RUBYFILL)
17.3000 | PACK | Freq: Once | INTRAVENOUS | Status: AC
  Administered 2022-12-05: 17.29 via INTRAVENOUS

## 2022-12-05 MED ORDER — RUBIDIUM RB82 GENERATOR (RUBYFILL)
17.3000 | PACK | Freq: Once | INTRAVENOUS | Status: AC
  Administered 2022-12-05: 17.23 via INTRAVENOUS

## 2022-12-05 NOTE — Progress Notes (Signed)
Patient presents for a cardiac PET stress test and tolerated procedure without incident. Patient maintained acceptable vital signs throughout the test and was offered caffeine after test.  Patient ambulated out of department with a steady gait.  

## 2022-12-06 LAB — NM PET CT CARDIAC PERFUSION MULTI W/ABSOLUTE BLOODFLOW
LV dias vol: 44 mL (ref 46–106)
LV sys vol: 8 mL
MBFR: 2.26
Nuc Rest EF: 78 %
Nuc Stress EF: 82 %
Peak HR: 91 {beats}/min
Rest HR: 67 {beats}/min
Rest MBF: 1.27 ml/g/min
Rest Nuclear Isotope Dose: 17.2 mCi
ST Depression (mm): 0 mm
Stress MBF: 2.87 ml/g/min
Stress Nuclear Isotope Dose: 17.3 mCi
TID: 1.06

## 2022-12-12 ENCOUNTER — Other Ambulatory Visit: Payer: Self-pay | Admitting: Internal Medicine

## 2022-12-12 DIAGNOSIS — Z0000 Encounter for general adult medical examination without abnormal findings: Secondary | ICD-10-CM

## 2022-12-21 ENCOUNTER — Ambulatory Visit
Admission: RE | Admit: 2022-12-21 | Discharge: 2022-12-21 | Disposition: A | Discharge status: Home / Self Care | Payer: Medicare Other | Source: Ambulatory Visit | Attending: Internal Medicine | Admitting: Internal Medicine

## 2022-12-21 DIAGNOSIS — Z0000 Encounter for general adult medical examination without abnormal findings: Secondary | ICD-10-CM

## 2022-12-23 NOTE — Progress Notes (Unsigned)
Cardiology Office Note:  .   Date:  12/25/2022  ID:  Caitlin Logan, DOB 1940-07-11, MRN [id number] PCP: Georgann Housekeeper, MD  [facility] Providers Cardiologist:  Charlton Haws, MD    History of Present Illness: .   Caitlin Logan is a 82 y.o. female with PMH of CAD s/p PCI of mLCx 2019 with anginal equivalent at that time shortness of breath, renal artery stenosis s/p stenting of left renal artery 2019, HTN, HLD, aortic atherosclerosis, and chronic HFpEF. Father had MI at age 69, mother had CABG and valve replacement at age 77, died at age 43.   Seen in cardiology clinic on 10/02/2022 by Dr. Lynnette Caffey (DOD work-in visit) for DOE. She reported increasing shortness of breath over the previous year as well as peripheral edema. Quite active, enjoys golf. Suspicion for diastolic heart failure. Was started on Lasix 20 mg twice daily for 3 days then 20 mg daily, Jardiance 10 mg daily, continue lisinopril and Lopressor.    Seen by me on 11/07/22 with continued DOE. Lost 5 lbs on Lasix, ankle swelling decreased. Can walk on a flat surface with no problems, notes SOB when walking up an incline or for a long distance.  Has to stop after walking for a while. Breathing improves after resting for a few minutes, then can complete walk. Continues to walk several times per week for exercise.  Also attends exercise classes and swimming. Has 1-2 cups of coffee each morning and then plain water the remainder of the day. Admits she only drinks  24-32 ounces daily. Home BP 120s over 70s. She denied chest pain, fatigue, palpitations, melena, hematuria, diaphoresis, weakness, presyncope, syncope, orthopnea, and PND. She was encouraged to improve hydration .Zetia was added to pravastatin for management of LDL 77 on 10/12/22.  Due to symptoms and strong family history, she underwent cardiac PET CT on 12/06/22 which was low risk.  Was no evidence of ischemia or infarction and LV perfusion was normal. She was encouraged to  gradually increase aerobic exercise and work on weight loss.  .  Today, she is here for follow-up. Reports she is not feeling any improvement since I saw her last. Is concerned about her kidney function and taking so many medications. Admits she continues to not drink enough water and urine is dark at times. She is pushing herself to walk more. No significant worsening of shortness of breath. Her children note her "huffing and puffing" when she is with them. She thinks this is 2/2 to being 10+ lbs overweight. Has LE edema if she does  not take Lasix. She denies chest pain, orthopnea, PND, presyncope, syncope.  We had a lengthy discussion about each of her medications and about diet and exercise.   ROS: See HPI  Studies Reviewed: .         Risk Assessment/Calculations:             Physical Exam:   VS:  BP 136/66   Pulse 61   Ht 5' (1.524 m)   Wt 143 lb 12.8 oz (65.2 kg)   SpO2 96%   BMI 28.08 kg/m    Wt Readings from Last 3 Encounters:  12/25/22 143 lb 12.8 oz (65.2 kg)  11/07/22 147 lb 3.2 oz (66.8 kg)  10/02/22 145 lb 9.6 oz (66 kg)    GEN: Well nourished, well developed in no acute distress NECK: No JVD; No carotid bruits CARDIAC: RRR, 3/6 systolic murmur. No rubs, gallops RESPIRATORY:  Clear to  auscultation without rales, wheezing or rhonchi  ABDOMEN: Soft, non-tender, non-distended EXTREMITIES:  No edema; No deformity   ASSESSMENT AND PLAN: .    DOE: Continues to have DOE. worse since May. Occasional mild LE edema. No orthopnea, PND, or weight gain. She is walking more for exercise and does not have worsening DOE or chest pain. Cardiac PET CT completed 12/06/22 revealed no evidence of ischemia or infarction and normal LV perfusion. We discussed the potential of further testing and she agrees that there is no indication at this time.  She follows a low sodium, heart healthy diet. I encouraged her to improve hydration and continue to work on increasing exercise to achieve 150  minutes of moderate intensity exercise each week.   Chronic HFpEF: She continues to have DOE but it has not worsened with increased exercise. No LE edema on Lasix but it is present if she does not take diuretic. No orthopnea, PND. Appears euvolemic on exam. As noted above, no indication for further testing at this time. Continue low sodium, heart healthy diet. Creatinine has been elevated on GDMT but it is stable. Encouraged improved hydration. Continue GDMT including empagliflozin, metoprolol, lisinopril, Lasix.   CAD without angina: Previous stent to LCx 2019.  She has been having DOE as noted above.  Cardiac PET CT completed 12/06/2022 revealed no evidence of ischemia or infarction. She is increasing the intensity of her exercise without chest pain for worsening dyspnea.  Lengthy discussion about LDL goal 55 or lower.  We will recheck lipid panel on ezetimibe and pravastatin. Consider PCSK9i if not at goal. No bleeding concerns. Continue aspirin, amlodipine.   Hypertension: BP is well controlled.   Aortic stenosis: Systolic murmur on exam. Echo 05/17/06 revealed moderate calcification of the aortic valve with trivial AI and mild aortic stenosis.  AV mean gradient 11.2 mmHg, AVA 1.89 cm. We discussed mild stenosis not likely causing DOE. We will continue to monitor valve function clinically for now.   Hyperlipidemia LDL goal < 55: Normal Lp(a), LDL 77 on 10/12/22. Intolerant to simvastatin, rosuvastatin, atorvastatin.  She is tolerating pravastatin without side effects. Ezetimibe 10 mg daily was added. She was concerned about kidney function but I advised these medications are not affecting her kidney function. We discussed    Renal artery stenosis: History of left renal artery stent. Reassuring renal ultrasound 02/07/22. BP is well controlled.        Dispo: 6 months with me  Signed, Levi Aland, NP-C 12/25/2022, 1:38 PM [address] Office [phone] Fax [phone]

## 2022-12-25 ENCOUNTER — Encounter: Payer: Self-pay | Admitting: Nurse Practitioner

## 2022-12-25 ENCOUNTER — Ambulatory Visit: Payer: Medicare Other | Attending: Nurse Practitioner | Admitting: Nurse Practitioner

## 2022-12-25 VITALS — BP 136/66 | HR 61 | Ht 60.0 in | Wt 143.8 lb

## 2022-12-25 DIAGNOSIS — I2510 Atherosclerotic heart disease of native coronary artery without angina pectoris: Secondary | ICD-10-CM | POA: Diagnosis present

## 2022-12-25 DIAGNOSIS — R0609 Other forms of dyspnea: Secondary | ICD-10-CM | POA: Diagnosis present

## 2022-12-25 DIAGNOSIS — E785 Hyperlipidemia, unspecified: Secondary | ICD-10-CM | POA: Diagnosis present

## 2022-12-25 DIAGNOSIS — I701 Atherosclerosis of renal artery: Secondary | ICD-10-CM | POA: Diagnosis present

## 2022-12-25 DIAGNOSIS — I350 Nonrheumatic aortic (valve) stenosis: Secondary | ICD-10-CM | POA: Insufficient documentation

## 2022-12-25 DIAGNOSIS — I10 Essential (primary) hypertension: Secondary | ICD-10-CM | POA: Insufficient documentation

## 2022-12-25 DIAGNOSIS — I5032 Chronic diastolic (congestive) heart failure: Secondary | ICD-10-CM | POA: Insufficient documentation

## 2022-12-25 NOTE — Patient Instructions (Signed)
Medication Instructions:   Alirocumab Injection What is this medication? ALIROCUMAB (al i ROC ue mab) treats high cholesterol. It may also be used to lower the risk of heart attack, stroke, and hospitalization for unstable angina. It works by decreasing bad cholesterol (such as LDL) in your blood. It is a monoclonal antibody. Changes to diet and exercise are often combined with this medication. This medicine may be used for other purposes; ask your health care provider or pharmacist if you have questions. COMMON BRAND NAME(S): Praluent What should I tell my care team before I take this medication? They need to know if you have any of these conditions: An unusual or allergic reaction to alirocumab, other medications, foods, dyes, or preservatives Pregnant or trying to get pregnant Breast-feeding How should I use this medication? This medication is injected under the skin. You will be taught how to prepare and give it. Take it as directed on the prescription label at the same time every day. Keep taking it unless your care team tells you to stop. It is important that you put your used needles and syringes in a special sharps container. Do not put them in a trash can. If you do not have a sharps container, call your pharmacist or care team to get one. This medication comes with INSTRUCTIONS FOR USE. Ask your pharmacist for directions on how to use this medication. Read the information carefully. Talk to your pharmacist or care team if you have questions. Talk to your care team about the use of this medication in children. While it may be prescribed to children as young as 8 years for selected conditions, precautions do apply. Overdosage: If you think you have taken too much of this medicine contact a poison control center or emergency room at once. NOTE: This medicine is only for you. Do not share this medicine with others. What if I miss a dose? It is important not to miss any doses. Talk to your  care team about what to do if you miss a dose. What may interact with this medication? Interactions are not expected. This list may not describe all possible interactions. Give your health care provider a list of all the medicines, herbs, non-prescription drugs, or dietary supplements you use. Also tell them if you smoke, drink alcohol, or use illegal drugs. Some items may interact with your medicine. What should I watch for while using this medication? Visit your care team for regular checks on your progress. Tell your care team if your symptoms do not start to get better or if they get worse. You may need blood work done while you are taking this medication. What side effects may I notice from receiving this medication? Side effects that you should report to your care team as soon as possible: Allergic reactions or angioedema--skin rash, itching or hives, swelling of the face, eyes, lips, tongue, arms, or legs, trouble swallowing or breathing Side effects that usually do not require medical attention (report these to your care team if they continue or are bothersome): Diarrhea Flu-like symptoms--fever, chills, muscle pain, cough, headache, fatigue Muscle pain Muscle spasms Pain, redness, or irritation at injection site This list may not describe all possible side effects. Call your doctor for medical advice about side effects. You may report side effects to FDA at 1-800-FDA-1088. Where should I keep my medication? Keep out of the reach of children and pets. Store in a refrigerator or at room temperature between 20 and 25 degrees C (68 and 77  degrees F). Protect from light. Do not shake. Avoid exposure to extreme heat. Refrigeration (preferred): Store in the refrigerator. Do not freeze. Keep this medication in the original carton until you are ready to take it. Remove the dose from the carton about 30 minutes before it is time for you to take it. Use it within 8 hours of removing it from the  carton. If the dose is out of the carton for more than 8 hours, throw it away. Throw away any unused medication after the expiration date. Room Temperature: This medication may be stored at room temperature for up to 30 days. Keep it in the original carton until you are ready to take it. Once removed from the carton, it must be used within 8 hours. If it is out of the carton for more than 8 hours, throw it away. If it is stored at room temperature, throw away any unused medication after 30 days, or after it expires, whichever is first. To get rid of medications that are no longer needed or have expired: Take the medication to a medication take-back program. Check with your pharmacy or law enforcement to find a location. If you cannot return the medication, ask your pharmacist or care team how to get rid of this medication safely. NOTE: This sheet is a summary. It may not cover all possible information. If you have questions about this medicine, talk to your doctor, pharmacist, or health care provider.  2024 Elsevier/Gold Standard (2022-08-16 00:00:00) Evolocumab Injection What is this medication? EVOLOCUMAB (e voe LOK ue mab) treats high cholesterol. It may also be used to lower the risk of heart attack, stroke, and a type of heart surgery. It works by decreasing bad cholesterol (such as LDL) in your blood. It is a monoclonal antibody. Changes to diet and exercise are often combined with this medication. This medicine may be used for other purposes; ask your health care provider or pharmacist if you have questions. COMMON BRAND NAME(S): Repatha, Repatha SureClick What should I tell my care team before I take this medication? They need to know if you have any of these conditions: An unusual or allergic reaction to evolocumab, latex, other medications, foods, dyes, or preservatives Pregnant or trying to get pregnant Breast-feeding How should I use this medication? This medication is injected under  the skin. You will be taught how to prepare and give it. Take it as directed on the prescription label at the same time every day. Keep taking it unless your care team tells you to stop. It is important that you put your used needles and syringes in a special sharps container. Do not put them in a trash can. If you do not have a sharps container, call your pharmacist or care team to get one. This medication comes with INSTRUCTIONS FOR USE. Ask your pharmacist for directions on how to use this medication. Read the information carefully. Talk to your pharmacist or care team if you have questions. Talk to your care team about the use of this medication in children. While it may be prescribed for children as young as 10 years for selected conditions, precautions do apply. Overdosage: If you think you have taken too much of this medicine contact a poison control center or emergency room at once. NOTE: This medicine is only for you. Do not share this medicine with others. What if I miss a dose? It is important not to miss any doses. Talk to your care team about what to do if  you miss a dose. What may interact with this medication? Interactions are not expected. This list may not describe all possible interactions. Give your health care provider a list of all the medicines, herbs, non-prescription drugs, or dietary supplements you use. Also tell them if you smoke, drink alcohol, or use illegal drugs. Some items may interact with your medicine. What should I watch for while using this medication? Visit your care team for regular checks on your progress. Tell your care team if your symptoms do not start to get better or if they get worse. You may need blood work while you are taking this medication. Do not wear the on-body infuser during an MRI. Taking this medication is only part of a total heart healthy program. Ask your care team if there are other changes you can make to improve your overall health. What  side effects may I notice from receiving this medication? Side effects that you should report to your care team as soon as possible: Allergic reactions or angioedema--skin rash, itching or hives, swelling of the face, eyes, lips, tongue, arms, or legs, trouble swallowing or breathing Side effects that usually do not require medical attention (report to your care team if they continue or are bothersome): Back pain Flu-like symptoms--fever, chills, muscle pain, cough, headache, fatigue Pain, redness, or irritation at injection site Runny or stuffy nose Sore throat This list may not describe all possible side effects. Call your doctor for medical advice about side effects. You may report side effects to FDA at 1-800-FDA-1088. Where should I keep my medication? Keep out of the reach of children and pets. Store in a refrigerator or at room temperature between 20 and 25 degrees C (68 and 77 degrees F). Refrigeration (preferred): Store it in the refrigerator. Do not freeze. Keep it in the original carton until you are ready to take it. Remove the dose from the carton about 30 minutes before it is time for you to take it. Get rid of any unused medication after the expiration date. Room temperature: This medication may be stored at room temperature for up to 30 days. Keep it in the original carton until you are ready to take it. If it is stored at room temperature, get rid of any unused medication after 30 days or after it expires, whichever is first. Protect from light. Do not shake. Avoid exposure to extreme heat. To get rid of medications that are no longer needed or have expired: Take the medication to a medication take-back program. Check with your pharmacy or law enforcement to find a location. If you cannot return the medication, ask your pharmacist or care team how to get rid of this medication safely. NOTE: This sheet is a summary. It may not cover all possible information. If you have questions  about this medicine, talk to your doctor, pharmacist, or health care provider.  2024 Elsevier/Gold Standard (2021-06-10 00:00:00)   *If you need a refill on your cardiac medications before your next appointment, please call your pharmacy*   Lab Work:   None ordered.  If you have labs (blood work) drawn today and your tests are completely normal, you will receive your results only by: MyChart Message (if you have MyChart) OR A paper copy in the mail If you have any lab test that is abnormal or we need to change your treatment, we will call you to review the results.   Testing/Procedures:  None ordered.   Follow-Up: At [facility], you and your  health needs are our priority.  As part of our continuing mission to provide you with exceptional heart care, we have created designated Provider Care Teams.  These Care Teams include your primary Cardiologist (physician) and Advanced Practice Providers (APPs -  Physician Assistants and Nurse Practitioners) who all work together to provide you with the care you need, when you need it.  We recommend signing up for the patient portal called "MyChart".  Sign up information is provided on this After Visit Summary.  MyChart is used to connect with patients for Virtual Visits (Telemedicine).  Patients are able to view lab/test results, encounter notes, upcoming appointments, etc.  Non-urgent messages can be sent to your provider as well.   To learn more about what you can do with MyChart, go to [email or web address].    Your next appointment:   6 month(s)  Provider:   Eligha Bridegroom, NP         Other Instructions  Will send you mychart message with February appointment with Lebron Conners on Tuesday, 1:30 or after.

## 2022-12-27 ENCOUNTER — Other Ambulatory Visit (HOSPITAL_BASED_OUTPATIENT_CLINIC_OR_DEPARTMENT_OTHER): Payer: Self-pay | Admitting: Nurse Practitioner

## 2022-12-27 ENCOUNTER — Ambulatory Visit: Payer: Medicare Other | Attending: Nurse Practitioner

## 2022-12-28 LAB — LIPID PANEL
Chol/HDL Ratio: 2 ratio (ref 0.0–4.4)
Cholesterol, Total: 152 mg/dL (ref 100–199)
HDL: 75 mg/dL (ref >39)
LDL Chol Calc (NIH): 62 mg/dL (ref 0–99)
Triglycerides: 76 mg/dL (ref 0–149)
VLDL Cholesterol Cal: 15 mg/dL (ref 5–40)

## 2023-02-04 IMAGING — MG MM DIGITAL SCREENING BILAT W/ TOMO AND CAD
8 series · 9 of 24 positions shown · non-contrast
Comparison: Previous exam(s).

CLINICAL DATA: Screening.

EXAM:
DIGITAL SCREENING BILATERAL MAMMOGRAM WITH TOMOSYNTHESIS AND CAD
TECHNIQUE: Bilateral screening digital craniocaudal and mediolateral oblique
mammograms were obtained. Bilateral screening digital breast
tomosynthesis was performed. The images were evaluated with
computer-aided detection.

[R CC synth-2D]
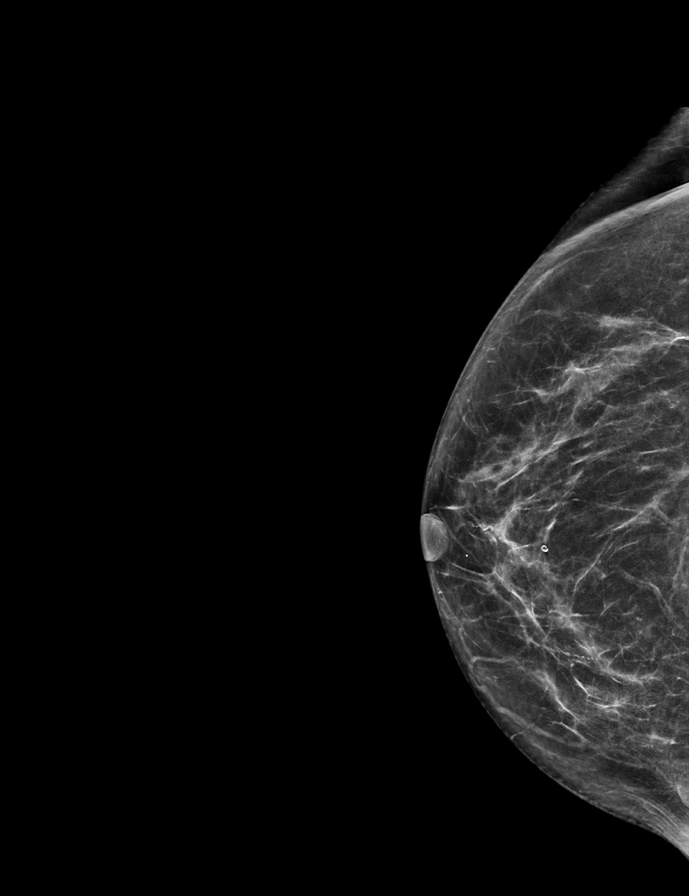

[R MLO synth-2D]
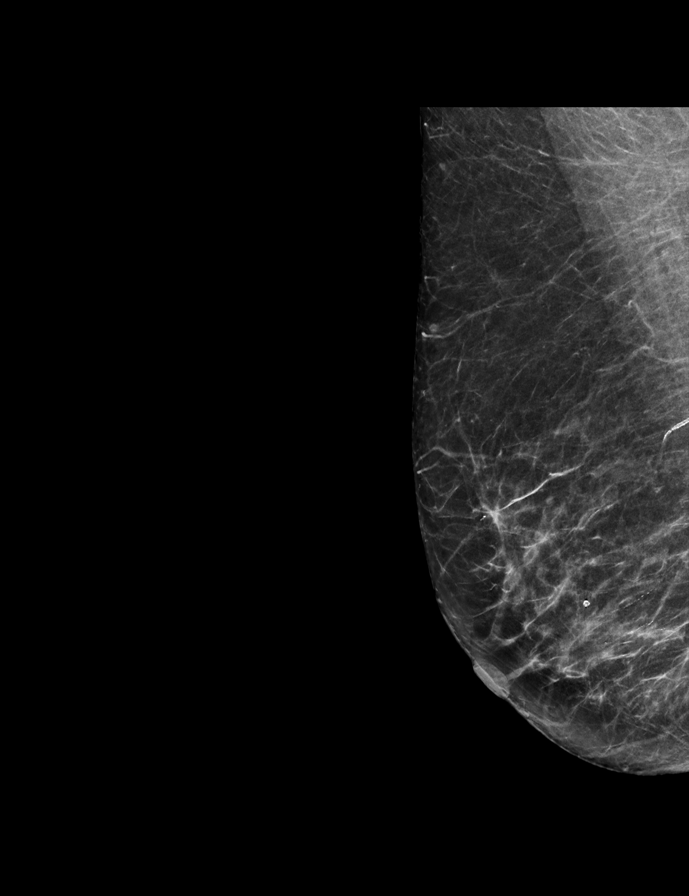

[L MLO synth-2D]
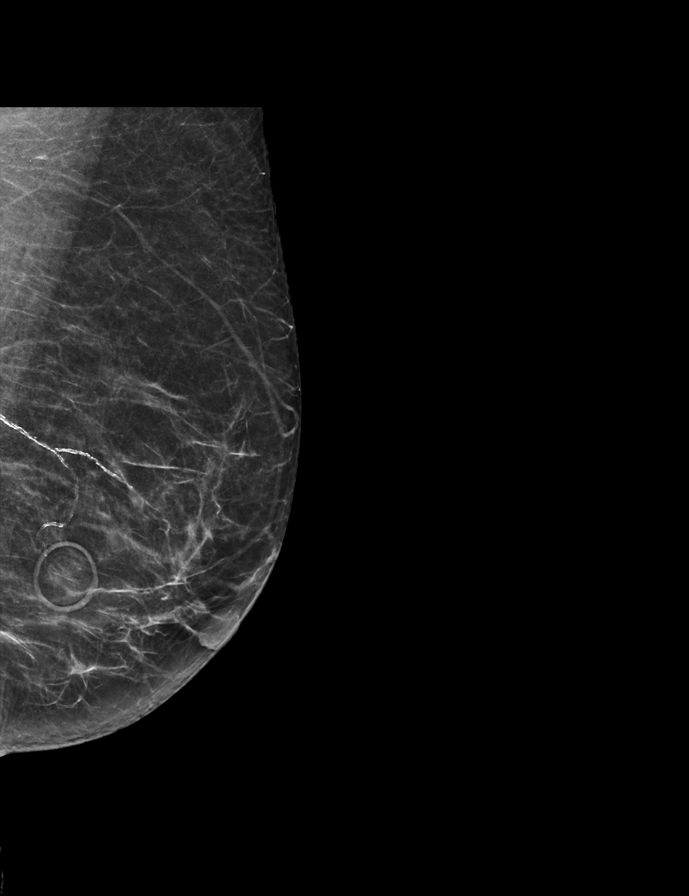

[L CC synth-2D]
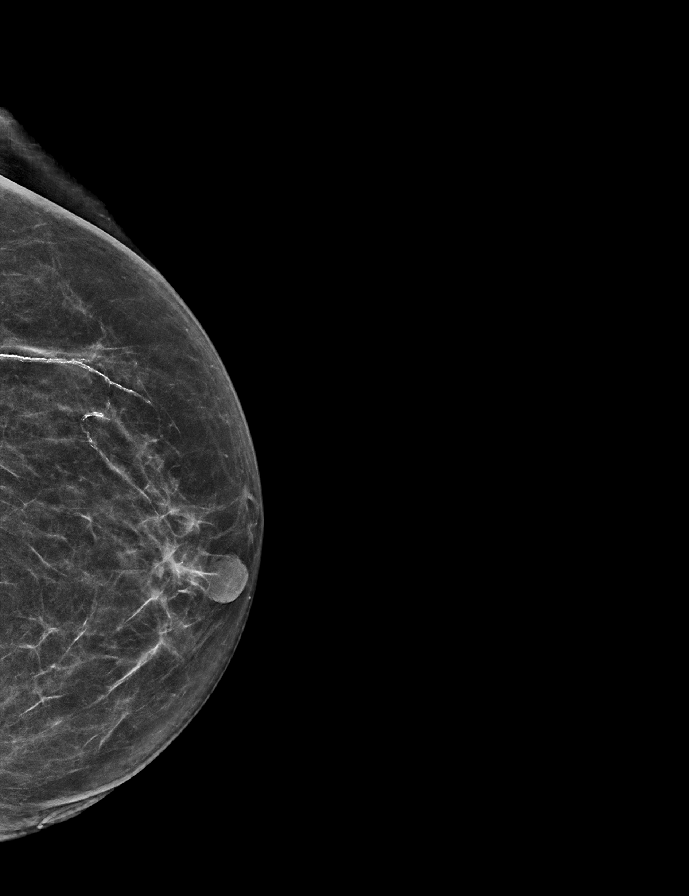

[L CC tomo · 2 of 61 frames shown]
[frame 20/61]
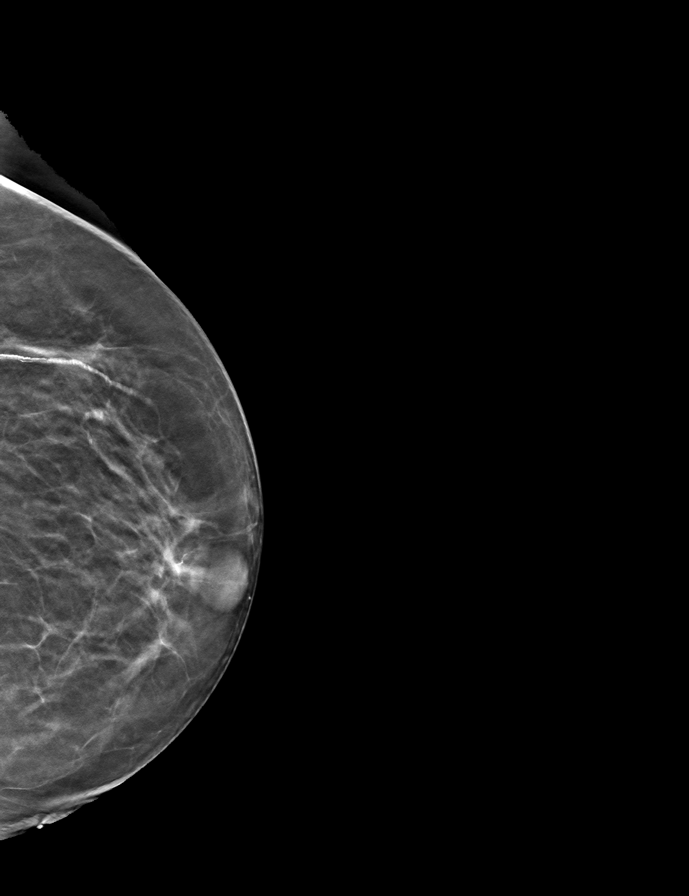
[frame 31/61]
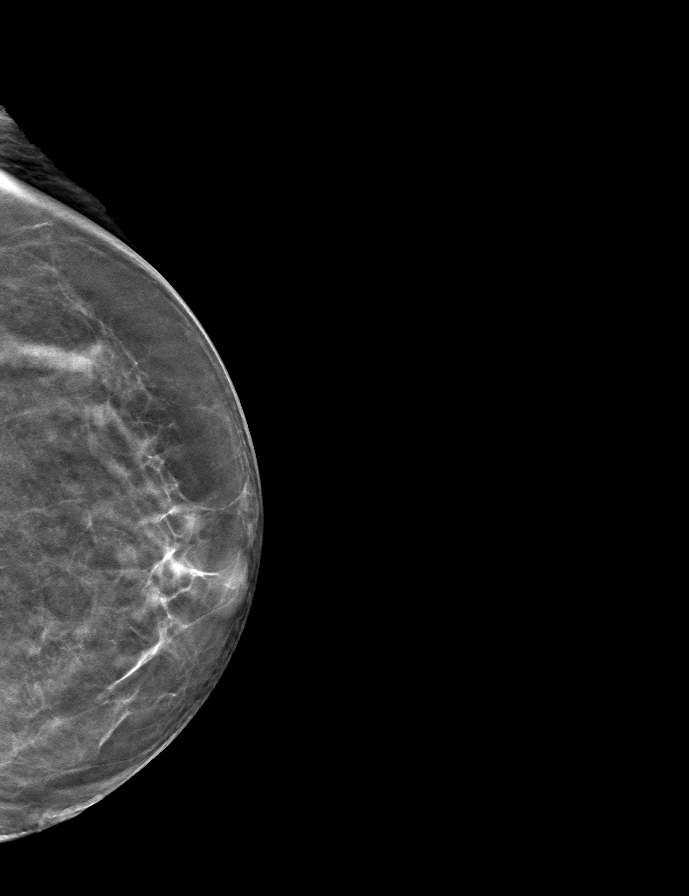

[R CC tomo · tomo slice 31/60.0]
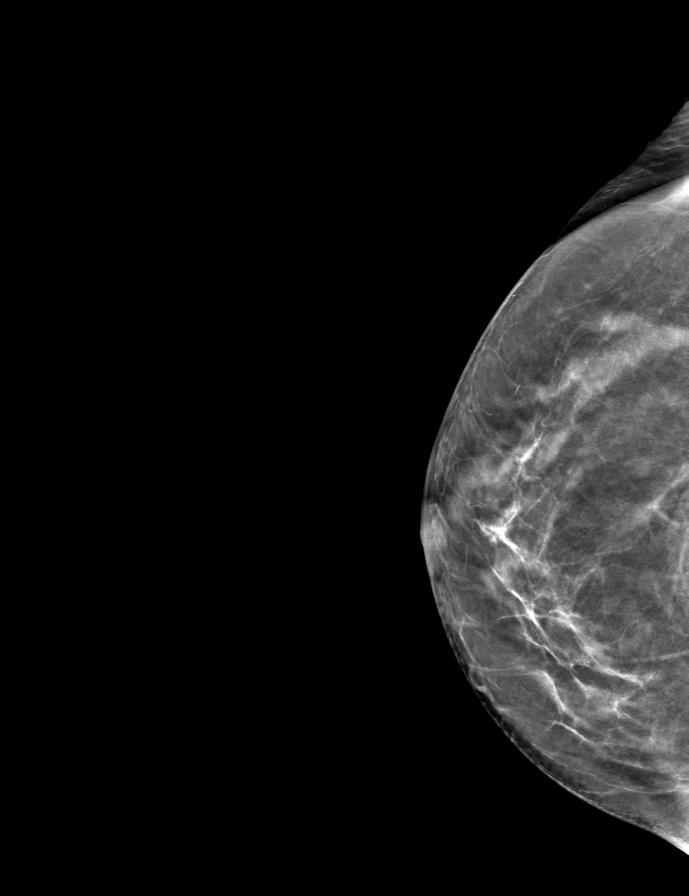

[R MLO tomo · tomo slice 28/55.0]
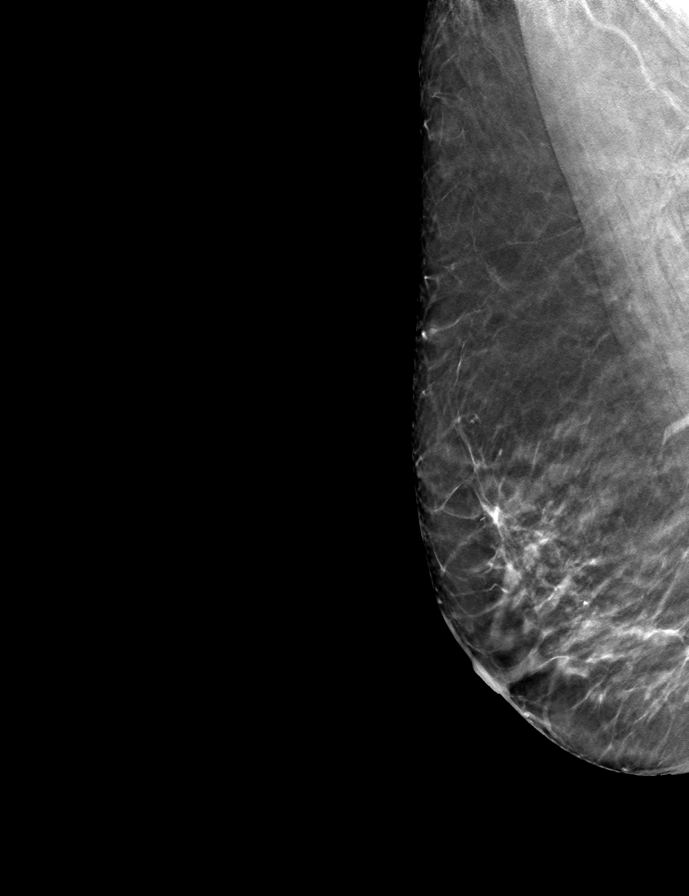

[L MLO tomo · tomo slice 27/54.0]
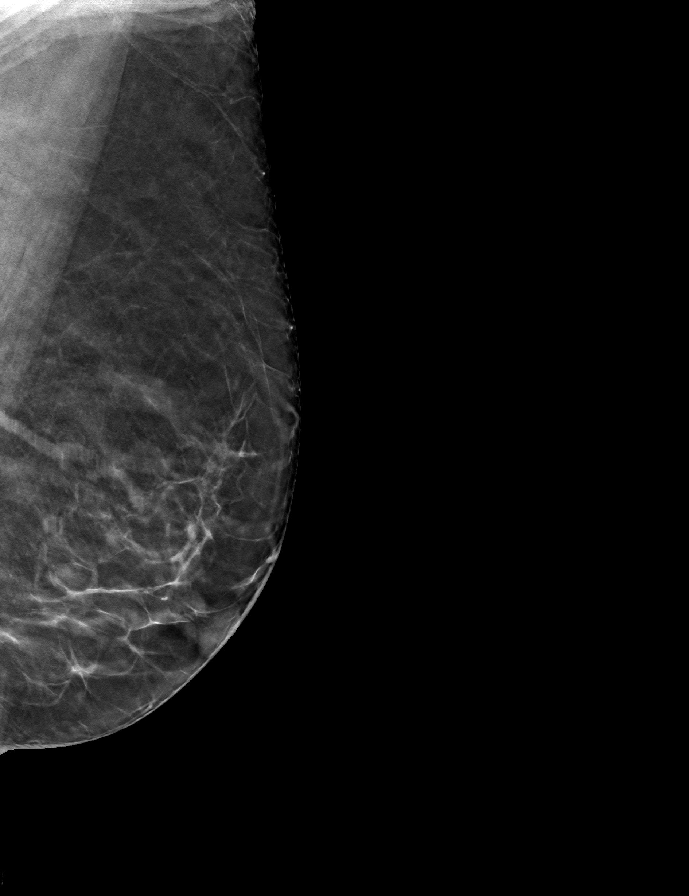

[9 of 24 positions shown; findings below may reference images not displayed]

ACR Breast Density Category b: There are scattered areas of
fibroglandular density.
FINDINGS: There are no findings suspicious for malignancy.
IMPRESSION: No mammographic evidence of malignancy. A result letter of this
screening mammogram will be mailed directly to the patient.

RECOMMENDATION:
Screening mammogram in one year. (Code:51-O-LD2)

BI-RADS CATEGORY  1: Negative.

## 2023-06-30 NOTE — Progress Notes (Unsigned)
Cardiology Office Note:  .   Date:  07/04/2023  ID:  Caitlin Logan, DOB Mar 29, 1941, MRN [id number] PCP: Caitlin Housekeeper, MD  [facility] Providers Cardiologist:  Caitlin Haws, MD    Patient Profile: .      PMH Coronary artery disease S/p PCI of mLCx 2019 Renal artery stenosis s/p stenting of left renal artery 2019 Hypertension Aortic valve calcification with mild AS on TTE 10/20/22 Hyperlipidemia Aortic atherosclerosis Chronic HFpEF Family history of early CAD Father had MI age 64 Mother had CABG and valve replacement (age 79)  She established with Caitlin Logan in 2020. History of angina and underwent DES to mid circumflex 11/29/2017 in [place].  Left RA stent placed 01/2018.  At the time, she was living at [facility].  Her husband had passed away 8 years prior.  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 She reported increasing shortness of breath over the previous year as well as peripheral edema.  She was quite active, enjoying golf.  Caitlin Logan was suspicious for diastolic heart failure and started her on Lasix 20 mg twice daily for 3 days then 20 mg daily along with Jardiance 10 mg daily.  She was advised to continue lisinopril and Lopressor.  Echo 10/20/2022 revealed moderate calcification of the aortic valve with trivial AI and mild AAS, AV mean gradient 11.2 mmHg, AVA 1.89 cm.  Seen in clinic by me on 11/07/2022 with continued DOE. She had lost 5 lbs since starting Lasix and her ankle swelling had decreased.  She reported shortness of breath when walking up an incline or for a long distance.  She was stopping to rest after walking for a while with improvement in breathing after a few minutes and then able to complete her walk.  She was attending exercise classes and swimming.  She admitted to only drinking 24 to 32 ounces of water daily and was advised to improve hydration.  She underwent PET/CT on 12/06/2022 which was low risk with no evidence  of ischemia or infarction and normal LV perfusion.  Last cardiology clinic visit was 12/25/2022 at which time she reported no improvement in symptoms.  She was concerned about her kidney function with taking so many medications.  Admitted her urine was dark at times.  She was pushing herself to walk more and was not having significant DOE.  Her children noted that she was "huffing and puffing" when she was with them.  She felt her symptoms were secondary to being overweight.  She was encouraged to continue to gradually increase exercise.  Labs revealed LDL C of 62.  We had discussed potential PCSK9 inhibitor, however she wanted to continue with taking pravastatin and ezetimibe.       History of Present Illness: .   Caitlin Logan is a very pleasant 83 y.o. female who is here today for follow-up of CAD and aortic stenosis. At prior visits, she had been concerned about persistent DOE, today she reports feeling well overall. She has been actively participating in exercise classes and has not experienced any significant breathing difficulties except when walking very fast. She denies chest pain and believes her medications, including Lasix, have helped manage her symptoms, particularly in reducing ankle swelling. She has been conscientious about her diet, adhering to an intermittent fasting schedule and avoiding junk food. She reports drinking black coffee in the morning and avoiding caffeine after noon. She aims for seven to eight hours of sleep per night. 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 She has been advised to hydrate more and has started adding electrolytes to her water. Cholesterol levels have improved, with a recent LDL level of 70. She is satisfied with this level and is not interested in adding more medications to her regimen to lower it further. She is currently on nine  medications and feels that this is sufficient. She denies shortness of breath, orthopnea, PND, palpitations, presyncope or syncope.   Discussed the use of AI scribe software for clinical note transcription with the patient, who gave verbal consent to proceed.   ROS: See HPI       Studies Reviewed: Marland Kitchen   EKG Interpretation Date/Time:  Tuesday July 03 2023 13:36:32 EST Ventricular Rate:  66 PR Interval:  162 QRS Duration:  90 QT Interval:  386 QTC Calculation: 404 R Axis:   38  Text Interpretation: Normal sinus rhythm Normal ECG When compared with ECG of 25-Dec-2018 12:21, No significant change was found Confirmed by Caitlin Logan [phone]) on 07/03/2023 1:50:02 PM    Risk Assessment/Calculations:             Physical Exam:   VS:  BP (!) 118/50   Pulse 66   Ht 5' (1.524 m)   Wt 143 lb 9.6 oz (65.1 kg)   SpO2 98%   BMI 28.04 kg/m    Wt Readings from Last 3 Encounters:  07/03/23 143 lb 9.6 oz (65.1 kg)  12/25/22 143 lb 12.8 oz (65.2 kg)  11/07/22 147 lb 3.2 oz (66.8 kg)    GEN: Well nourished, well developed in no acute distress NECK: No JVD; No carotid bruits CARDIAC: RRR, 2/6 systolic murmur. No rubs, gallops RESPIRATORY:  Clear to auscultation without rales, wheezing or rhonchi  ABDOMEN: Soft, non-tender, non-distended EXTREMITIES:  No edema; No deformity     ASSESSMENT AND PLAN: .    CAD without angina: History of PCI to mid LCx in 2019. History of DOE that she reports only occurs with walking fast and improves when she slows down. No chest pain. Reassuring cardiac PET CT 11/2022. No indication for further ischemia evaluation at this time. EKG reveals NSR with no concerning ST abnormality. No bleeding concerns.  Continue aspirin, ezetimibe, metoprolol, pravastatin, and lisinopril. Continue to focus on secondary prevention including heart healthy mostly plant based diet avoiding saturated fat, processed foods, simple carbohydrates, and sugar along with aiming for at  least 150 minutes of moderate intensity exercise each week.   Hyperlipidemia  LDL goal < 70/Aortic atherosclerosis: LP(a) is not elevated.  Lipid panel completed 03/06/2023 with total cholesterol 159, triglycerides 106, LDL-C 70, and HDL 71. We had a lengthy discussion about goal of LDL 55 or lower versus 70 or lower. She feels her LDL is well controlled and does not wish to change or add additional lipid lowering therapy. Says she will never take PCSK9i. She eats a healthy diet and exercises regularly. We will aim for goal LDL 70 or lower which she has achieved. Continue pravastatin and ezetimibe.   Renal artery stenosis: History of left renal artery stent placement. Renal artery u/s 01/2022 revealed patent stent, no additional concerns. No acute concerns today. BP is well controlled.   Hypertension: BP is well controlled. She feels current anti-hypertensive regimen is effective. Renal function on labs completed 03/06/23 is stable. Continue amlodipine, metoprolol, lisinopril, and furosemide.  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 Lengthy discussion about management of CKD. She does not drink soda and rarely takes NSAIDs. Is working on better hydration. We discussed nephrotoxic medications and further monitoring. She does not wish to be referred to nephrology at this time, feels like PCP is monitoring appropriately. She feels her medication regimen is working well, no changes today.   Aortic stenosis: Echo 10/20/22 revealed moderate calcification of the aortic valve with mild aortic stenosis, mean gradient 11.2 mmHg, AVA by VTI is 1.89 cm.  LVEF is normal at 60 to 65%.  We discussed further surveillance. She is asymptomatic.  Brief discussion about consideration of TAVR should AS become severe; she would be willing to consider TAVR. We will continue to monitor clinically for now, consider repeat echo at next office visit , sooner if clinically indicated.        Disposition:1 year  with Caitlin Logan or me  Signed, Caitlin Bridegroom, NP-C

## 2023-07-03 ENCOUNTER — Ambulatory Visit: Payer: Medicare Other | Attending: Nurse Practitioner | Admitting: Nurse Practitioner

## 2023-07-03 ENCOUNTER — Encounter: Payer: Self-pay | Admitting: Nurse Practitioner

## 2023-07-03 VITALS — BP 118/50 | HR 66 | Ht 60.0 in | Wt 143.6 lb

## 2023-07-03 DIAGNOSIS — I10 Essential (primary) hypertension: Secondary | ICD-10-CM | POA: Insufficient documentation

## 2023-07-03 DIAGNOSIS — I701 Atherosclerosis of renal artery: Secondary | ICD-10-CM | POA: Diagnosis present

## 2023-07-03 DIAGNOSIS — I350 Nonrheumatic aortic (valve) stenosis: Secondary | ICD-10-CM | POA: Insufficient documentation

## 2023-07-03 DIAGNOSIS — R0609 Other forms of dyspnea: Secondary | ICD-10-CM | POA: Diagnosis present

## 2023-07-03 DIAGNOSIS — E785 Hyperlipidemia, unspecified: Secondary | ICD-10-CM | POA: Insufficient documentation

## 2023-07-03 DIAGNOSIS — N1832 Chronic kidney disease, stage 3b: Secondary | ICD-10-CM | POA: Insufficient documentation

## 2023-07-03 DIAGNOSIS — I2510 Atherosclerotic heart disease of native coronary artery without angina pectoris: Secondary | ICD-10-CM | POA: Insufficient documentation

## 2023-07-03 NOTE — Patient Instructions (Signed)
Medication Instructions:   Your physician recommends that you continue on your current medications as directed. Please refer to the Current Medication list given to you today.   *If you need a refill on your cardiac medications before your next appointment, please call your pharmacy*   Lab Work:  None ordered.   If you have labs (blood work) drawn today and your tests are completely normal, you will receive your results only by: MyChart Message (if you have MyChart) OR A paper copy in the mail If you have any lab test that is abnormal or we need to change your treatment, we will call you to review the results.   Testing/Procedures:  None ordered.   Follow-Up: At [facility], you and your health needs are our priority.  As part of our continuing mission to provide you with exceptional heart care, we have created designated Provider Care Teams.  These Care Teams include your primary Cardiologist (physician) and Advanced Practice Providers (APPs -  Physician Assistants and Nurse Practitioners) who all work together to provide you with the care you need, when you need it.  We recommend signing up for the patient portal called "MyChart".  Sign up information is provided on this After Visit Summary.  MyChart is used to connect with patients for Virtual Visits (Telemedicine).  Patients are able to view lab/test results, encounter notes, upcoming appointments, etc.  Non-urgent messages can be sent to your provider as well.   To learn more about what you can do with MyChart, go to [email or web address].    Your next appointment:   1 year(s)  Provider:   Eligha Bridegroom, NP    Other Instructions  Your physician wants you to follow-up in: 1 year.  You will receive a reminder letter in the mail two months in advance. If you don't receive a letter, please call our office to schedule the follow-up appointment.   [address], [address] [place], [place] [address]  #  [phone]  [address] [place], [place] [address]  # [phone]

## 2023-07-04 ENCOUNTER — Encounter: Payer: Self-pay | Admitting: Nurse Practitioner

## 2023-09-05 ENCOUNTER — Other Ambulatory Visit: Payer: Self-pay | Admitting: Internal Medicine

## 2023-09-16 ENCOUNTER — Other Ambulatory Visit: Payer: Self-pay | Admitting: Internal Medicine

## 2023-11-04 ENCOUNTER — Other Ambulatory Visit: Payer: Self-pay | Admitting: Nurse Practitioner

## 2023-11-07 ENCOUNTER — Other Ambulatory Visit: Payer: Self-pay

## 2023-11-07 MED ORDER — EZETIMIBE 10 MG PO TABS: 10.0000 mg | ORAL_TABLET | Freq: Every day | ORAL | 3 refills | Status: AC

## 2023-12-11 ENCOUNTER — Other Ambulatory Visit: Payer: Self-pay | Admitting: Internal Medicine

## 2023-12-11 DIAGNOSIS — Z1231 Encounter for screening mammogram for malignant neoplasm of breast: Secondary | ICD-10-CM

## 2024-01-03 ENCOUNTER — Ambulatory Visit
Admission: RE | Admit: 2024-01-03 | Discharge: 2024-01-03 | Disposition: A | Discharge status: Home / Self Care | Source: Ambulatory Visit | Attending: Internal Medicine | Admitting: Internal Medicine

## 2024-01-03 DIAGNOSIS — Z1231 Encounter for screening mammogram for malignant neoplasm of breast: Secondary | ICD-10-CM

## 2024-05-19 ENCOUNTER — Encounter (HOSPITAL_COMMUNITY): Payer: Self-pay

## 2024-05-19 ENCOUNTER — Emergency Department (HOSPITAL_COMMUNITY)
Admission: EM | Admit: 2024-05-19 | Discharge: 2024-05-19 | Disposition: A | Discharge status: Home / Self Care | Attending: Emergency Medicine | Admitting: Emergency Medicine

## 2024-05-19 DIAGNOSIS — I159 Secondary hypertension, unspecified: Secondary | ICD-10-CM | POA: Diagnosis not present

## 2024-05-19 DIAGNOSIS — Z79899 Other long term (current) drug therapy: Secondary | ICD-10-CM | POA: Insufficient documentation

## 2024-05-19 DIAGNOSIS — I10 Essential (primary) hypertension: Secondary | ICD-10-CM | POA: Diagnosis present

## 2024-05-19 DIAGNOSIS — Z7982 Long term (current) use of aspirin: Secondary | ICD-10-CM | POA: Diagnosis not present

## 2024-05-19 LAB — CBC WITH DIFFERENTIAL/PLATELET
Abs Immature Granulocytes: 0 K/uL (ref 0.00–0.07)
Basophils Absolute: 0 K/uL (ref 0.0–0.1)
Basophils Relative: 1 %
Eosinophils Absolute: 0.1 K/uL (ref 0.0–0.5)
Eosinophils Relative: 2 %
HCT: 42.8 % (ref 36.0–46.0)
Hemoglobin: 13.8 g/dL (ref 12.0–15.0)
Immature Granulocytes: 0 %
Lymphocytes Relative: 20 %
Lymphs Abs: 0.8 K/uL (ref 0.7–4.0)
MCH: 29.6 pg (ref 26.0–34.0)
MCHC: 32.2 g/dL (ref 30.0–36.0)
MCV: 91.8 fL (ref 80.0–100.0)
Monocytes Absolute: 0.5 K/uL (ref 0.1–1.0)
Monocytes Relative: 13 %
Neutro Abs: 2.6 K/uL (ref 1.7–7.7)
Neutrophils Relative %: 64 %
Platelets: 217 K/uL (ref 150–400)
RBC: 4.66 MIL/uL (ref 3.87–5.11)
RDW: 14.6 % (ref 11.5–15.5)
WBC: 4 K/uL (ref 4.0–10.5)
nRBC: 0 % (ref 0.0–0.2)

## 2024-05-19 LAB — BASIC METABOLIC PANEL WITH GFR
Anion gap: 9 (ref 5–15)
BUN: 22 mg/dL (ref 8–23)
CO2: 25 mmol/L (ref 22–32)
Calcium: 9.3 mg/dL (ref 8.9–10.3)
Chloride: 105 mmol/L (ref 98–111)
Creatinine, Ser: 1.24 mg/dL — ABNORMAL HIGH (ref 0.44–1.00)
GFR, Estimated: 43 mL/min — ABNORMAL LOW
Glucose, Bld: 89 mg/dL (ref 70–99)
Potassium: 4.2 mmol/L (ref 3.5–5.1)
Sodium: 139 mmol/L (ref 135–145)

## 2024-05-19 MED ORDER — HYDRALAZINE HCL 10 MG PO TABS
10.0000 mg | ORAL_TABLET | Freq: Once | ORAL | Status: AC
  Administered 2024-05-19: 10 mg via ORAL
  Filled 2024-05-19: qty 1

## 2024-05-19 NOTE — ED Provider Notes (Signed)
 " [place] EMERGENCY DEPARTMENT AT [facility] Provider Note   CSN: [id number] Arrival date & time: 05/19/24  9085     Patient presents with: Hypertension   Caitlin Logan is a 84 y.o. female.  With a history of hypertension who presents to the ED for hypertension.  Persistently elevated blood pressures at home over the last 3 days.  Systolic greater than 200.  Instructed to come here for further evaluation.  Denies severe headaches, changes in vision, chest pain shortness of breath and other symptoms.  Compliant with amlodipine  metoprolol  lisinopril  along with other meds.  No recent dosage changes.    Hypertension       Prior to Admission medications  Medication Sig Start Date End Date Taking? Authorizing Provider  amLODipine  (NORVASC ) 5 MG tablet Take 5 mg by mouth 2 (two) times daily. 04/21/20   [provider]  aspirin  EC 81 MG tablet Take 81 mg by mouth daily.    [provider]  cholecalciferol (VITAMIN D3) 25 MCG (1000 UNIT) tablet Take 2,000 Units by mouth daily.    [provider]  ezetimibe  (ZETIA ) 10 MG tablet Take 1 tablet (10 mg total) by mouth daily. 11/07/23   Swinyer, Rosaline HERO, NP  furosemide  (LASIX ) 20 MG tablet TAKE 2 TABLETS DAILY FOR 3 DAYS, THEN 1 TABLET DAILY 09/18/23   Thukkani, Arun K, MD  JARDIANCE  10 MG TABS tablet TAKE 1 TABLET(10 MG) BY MOUTH DAILY BEFORE BREAKFAST 09/05/23   Thukkani, Arun K, MD  lisinopril  (ZESTRIL ) 40 MG tablet Take 1 tablet (40 mg total) by mouth daily. 03/09/20   Nishan, Peter C, MD  metoprolol  tartrate (LOPRESSOR ) 25 MG tablet Take 25 mg by mouth 2 (two) times daily.    [provider]  pravastatin  (PRAVACHOL ) 40 MG tablet Take 1 tablet (40 mg total) by mouth at bedtime. 11/26/20   Nishan, Peter C, MD  SYNTHROID  88 MCG tablet Take 88 mcg by mouth daily. 04/05/18   [provider]    Allergies: Hydrocodone , Oxycodone hcl, Propoxyphene, Atorvastatin, Rosuvastatin, and Simvastatin     Review of Systems  Updated Vital Signs BP (!) 194/67 Comment: Pulse 61  Pulse 61   Temp 97.8 F (36.6 C)   Resp 16   SpO2 99%   Physical Exam Vitals and nursing note reviewed.  HENT:     Head: Normocephalic and atraumatic.  Eyes:     Pupils: Pupils are equal, round, and reactive to light.  Cardiovascular:     Rate and Rhythm: Normal rate and regular rhythm.  Pulmonary:     Effort: Pulmonary effort is normal.     Breath sounds: Normal breath sounds.  Abdominal:     Palpations: Abdomen is soft.     Tenderness: There is no abdominal tenderness.  Musculoskeletal:     Right lower leg: No edema.     Left lower leg: No edema.  Skin:    General: Skin is warm and dry.  Neurological:     Mental Status: She is alert.  Psychiatric:        Mood and Affect: Mood normal.     (all labs ordered are listed, but only abnormal results are displayed) Labs Reviewed  BASIC METABOLIC PANEL WITH GFR - Abnormal; Notable for the following components:      Result Value   Creatinine, Ser 1.24 (*)    GFR, Estimated 43 (*)    All other components within normal limits  CBC WITH DIFFERENTIAL/PLATELET  EKG: None  Radiology: No results found.   Procedures   Medications Ordered in the ED  hydrALAZINE  (APRESOLINE ) tablet 10 mg (10 mg Oral Given 05/19/24 1031)    Clinical Course as of 05/19/24 1103  Mon May 19, 2024  1102 Renal function actually improved from prior labs.  No other concerning abnormalities.  Patient is remained stable with slight improvement in blood pressure.  EKG without dysrhythmia or ischemic changes.  Appropriate for discharge with PCP follow-up for hypertension reevaluation [MP]    Clinical Course User Index [MP] Caitlin Logan LABOR, DO                                 Medical Decision Making 84 year old female with history as above presenting for hypertension.  Systolic blood pressure greater than 200 at home.  No symptoms that would be consistent with endorgan  damage but will obtain labs and EKG.  Will trial small dose of hydralazine  here and instruct for PCP follow-up for further dosage adjustments and reevaluation of hypertension  Amount and/or Complexity of Data Reviewed Labs: ordered.  Risk Prescription drug management.        Final diagnoses:  Secondary hypertension    ED Discharge Orders     None          Caitlin Logan LABOR, DO 05/19/24 1103  "

## 2024-05-19 NOTE — ED Notes (Signed)
 Report called to [facility]. Transportation from the facility arranged.

## 2024-05-19 NOTE — ED Triage Notes (Signed)
 Pt lives at [facility]

## 2024-05-19 NOTE — ED Triage Notes (Addendum)
 Pt BIB [facility] from clinic, pt bp has been elevated since Friday, went to clinic to be evaluated. BP was 200 systolic initially, advised to come to ER to be evaluated. No changes in medications.

## 2024-05-19 NOTE — Discharge Instructions (Signed)
 You were seen in the emerged part for high blood pressure We gave you 1 dose of a medication called hydralazine  that helped your blood pressure come down a little bit Your blood work looked okay Your EKG looked okay as well You need to follow-up with your primary doctor to discuss medication management and recheck your blood pressure Return to the Emergency Department for severe headaches chest pain trouble breathing or other concerns

## 2024-05-19 NOTE — ED Notes (Signed)
 Pt verbalizes understanding of discharge instructions. Opportunity for questioning and answers were provided. Pt discharged from ED. Pt a&ox4 and waiting transportation in lobby.

## 2024-06-01 ENCOUNTER — Other Ambulatory Visit: Payer: Self-pay | Admitting: Internal Medicine

## 2024-06-04 ENCOUNTER — Other Ambulatory Visit: Payer: Self-pay | Admitting: Cardiovascular Disease

## 2024-06-04 ENCOUNTER — Other Ambulatory Visit: Payer: Self-pay | Admitting: Internal Medicine

## 2024-06-04 NOTE — Telephone Encounter (Signed)
 Labs on 05/19/24 are abnormal.   In accordance with refill protocols, please review and address the following requirements before this medication refill can be authorized:  Labs  and Vital signs

## 2024-07-03 ENCOUNTER — Ambulatory Visit (HOSPITAL_BASED_OUTPATIENT_CLINIC_OR_DEPARTMENT_OTHER): Admitting: Nurse Practitioner
